# Patient Record
Sex: Male | Born: 1960 | Race: White | Hispanic: No | Marital: Married | Smoking: Never smoker
Health system: Southern US, Community
[De-identification: ages and names within clinical notes are randomized; demographics above are authoritative.]

## PROBLEM LIST (undated history)

## (undated) DIAGNOSIS — E785 Hyperlipidemia, unspecified: Secondary | ICD-10-CM

## (undated) DIAGNOSIS — E559 Vitamin D deficiency, unspecified: Secondary | ICD-10-CM

## (undated) DIAGNOSIS — K760 Fatty (change of) liver, not elsewhere classified: Secondary | ICD-10-CM

## (undated) DIAGNOSIS — M1710 Unilateral primary osteoarthritis, unspecified knee: Secondary | ICD-10-CM

## (undated) DIAGNOSIS — I2510 Atherosclerotic heart disease of native coronary artery without angina pectoris: Secondary | ICD-10-CM

## (undated) DIAGNOSIS — E663 Overweight: Secondary | ICD-10-CM

## (undated) DIAGNOSIS — I10 Essential (primary) hypertension: Secondary | ICD-10-CM

## (undated) DIAGNOSIS — L111 Transient acantholytic dermatosis [Grover]: Secondary | ICD-10-CM

## (undated) DIAGNOSIS — R7301 Impaired fasting glucose: Secondary | ICD-10-CM

## (undated) DIAGNOSIS — C4490 Unspecified malignant neoplasm of skin, unspecified: Secondary | ICD-10-CM

## (undated) DIAGNOSIS — M1990 Unspecified osteoarthritis, unspecified site: Secondary | ICD-10-CM

## (undated) DIAGNOSIS — E7841 Elevated Lipoprotein(a): Secondary | ICD-10-CM

## (undated) DIAGNOSIS — L309 Dermatitis, unspecified: Secondary | ICD-10-CM

## (undated) DIAGNOSIS — F419 Anxiety disorder, unspecified: Secondary | ICD-10-CM

## (undated) DIAGNOSIS — J45909 Unspecified asthma, uncomplicated: Secondary | ICD-10-CM

## (undated) DIAGNOSIS — M75100 Unspecified rotator cuff tear or rupture of unspecified shoulder, not specified as traumatic: Secondary | ICD-10-CM

## (undated) HISTORY — DX: Unilateral primary osteoarthritis, unspecified knee: M17.10

## (undated) HISTORY — DX: Dermatitis, unspecified: L30.9

## (undated) HISTORY — DX: Fatty (change of) liver, not elsewhere classified: K76.0

## (undated) HISTORY — PX: SHOULDER SURGERY: SHX246

## (undated) HISTORY — DX: Unspecified osteoarthritis, unspecified site: M19.90

## (undated) HISTORY — DX: Essential (primary) hypertension: I10

## (undated) HISTORY — DX: Anxiety disorder, unspecified: F41.9

## (undated) HISTORY — PX: KNEE SURGERY: SHX244

## (undated) HISTORY — PX: KNEE ARTHROSCOPY: SUR90

## (undated) HISTORY — DX: Hyperlipidemia, unspecified: E78.5

## (undated) HISTORY — DX: Unspecified rotator cuff tear or rupture of unspecified shoulder, not specified as traumatic: M75.100

## (undated) HISTORY — DX: Gilbert syndrome: E80.4

## (undated) HISTORY — DX: Overweight: E66.3

## (undated) HISTORY — DX: Impaired fasting glucose: R73.01

## (undated) HISTORY — DX: Atherosclerotic heart disease of native coronary artery without angina pectoris: I25.10

## (undated) HISTORY — DX: Transient acantholytic dermatosis (grover): L11.1

## (undated) HISTORY — DX: Elevated lipoprotein(a): E78.41

## (undated) HISTORY — PX: COLONOSCOPY: SHX174

## (undated) HISTORY — DX: Unspecified asthma, uncomplicated: J45.909

## (undated) HISTORY — DX: Vitamin D deficiency, unspecified: E55.9

---

## 2007-05-18 ENCOUNTER — Encounter: Admission: RE | Admit: 2007-05-18 | Discharge: 2007-05-18 | Payer: Self-pay | Admitting: Orthopedic Surgery

## 2011-09-23 ENCOUNTER — Other Ambulatory Visit: Payer: Self-pay | Admitting: Family Medicine

## 2011-09-23 DIAGNOSIS — M25511 Pain in right shoulder: Secondary | ICD-10-CM

## 2011-09-28 ENCOUNTER — Ambulatory Visit
Admission: RE | Admit: 2011-09-28 | Discharge: 2011-09-28 | Disposition: A | Discharge status: Home / Self Care | Payer: BC Managed Care – PPO | Source: Ambulatory Visit | Attending: Family Medicine | Admitting: Family Medicine

## 2011-09-28 DIAGNOSIS — M25511 Pain in right shoulder: Secondary | ICD-10-CM

## 2011-09-28 MED ORDER — IOHEXOL 180 MG/ML  SOLN
15.0000 mL | Freq: Once | INTRAMUSCULAR | Status: AC | PRN
  Administered 2011-09-28: 15 mL via INTRA_ARTICULAR

## 2012-10-25 ENCOUNTER — Encounter: Payer: Self-pay | Admitting: Family Medicine

## 2012-10-25 ENCOUNTER — Ambulatory Visit (INDEPENDENT_AMBULATORY_CARE_PROVIDER_SITE_OTHER): Payer: BC Managed Care – PPO | Admitting: Family Medicine

## 2012-10-25 VITALS — BP 145/91 | HR 89 | Ht 66.0 in | Wt 168.0 lb

## 2012-10-25 DIAGNOSIS — M25569 Pain in unspecified knee: Secondary | ICD-10-CM

## 2012-10-25 DIAGNOSIS — M25561 Pain in right knee: Secondary | ICD-10-CM

## 2012-10-25 NOTE — Patient Instructions (Addendum)
Take tylenol 500mg  1-2 tabs three times a day for pain. Meloxicam 7.5mg  twice a day with food for pain and inflammation. Glucosamine sulfate 750mg  twice a day is a supplement that may help with arthritis. Capsaicin topically up to four times a day may also help with pain. Cortisone injections are an option - you were given one of these today. If cortisone injections do not help, there are different types of shots that may help but they take longer to take effect. It's important that you continue to stay active. Straight leg raises, leg raises with foot turned outward, hip side raises, knee extensions - 3 sets of 10 once a day - add ankle weight if these become too easy. Can consider PT again but you've done this extensively. Shoe inserts with good arch support may be helpful - especially with your flat feet and mild circumduction of your gait. Heat or ice 15 minutes at a time 3-4 times a day as needed to help with pain. Follow up with me as needed.

## 2012-10-26 ENCOUNTER — Encounter: Payer: Self-pay | Admitting: Family Medicine

## 2012-10-26 DIAGNOSIS — M25561 Pain in right knee: Secondary | ICD-10-CM | POA: Insufficient documentation

## 2012-10-26 NOTE — Progress Notes (Signed)
  Subjective:    Patient ID: Jeff Murphy, male    DOB: [birth year], 52 y.o.   MRN: [id number]  PCP: Dr. Prince Rome  HPI 52 yo M here for right knee pain.  Patient reports he's had prior left knee issues primarily. Had an arthroscopy back in 2000 and improved from this - now left knee isn't bothering him. Now right knee causing him issues. Tore meniscus in fall 2012 - had arthroscopic debridement after MRI showed bucket handle medial and lateral meniscal tears - told had arthritis lateral compartment of knee. Able to play hockey but cannot run, play many other sports due to pain in right knee mostly laterally. Tried nsaids, now on meloxicam. If he does any sports activities the next couple days will have pretty bad pain. Did 6 weeks of physical therapy within past 3 months with only some improvement. Catches if he fully extends knee.  Otherwise no locking, giving out.  History reviewed. No pertinent past medical history.  No current outpatient prescriptions on file prior to visit.   No current facility-administered medications on file prior to visit.    Past Surgical History  Procedure Laterality Date  . Knee surgery Left     meniscal debridement  . Shoulder surgery Right     tenodesis - ?biceps    Allergies  Allergen Reactions  . Neosporin Original (Bacitracin-Neomycin-Polymyxin)   . Polysporin (Bacitracin-Polymyxin B)     History   Social History  . Marital Status: Married    Spouse Name: N/A    Number of Children: N/A  . Years of Education: N/A   Occupational History  . Not on file.   Social History Main Topics  . Smoking status: Never Smoker   . Smokeless tobacco: Not on file  . Alcohol Use: Not on file  . Drug Use: Not on file  . Sexually Active: Not on file   Other Topics Concern  . Not on file   Social History Narrative  . No narrative on file    Family History  Problem Relation Age of Onset  . Hypertension Father   . Hypertension Brother   .  Diabetes Neg Hx   . Heart attack Neg Hx   . Hyperlipidemia Neg Hx   . Sudden death Neg Hx     BP 145/91  Pulse 89  Ht 5\' 6"  (1.676 m)  Wt 168 lb (76.204 kg)  BMI 27.13 kg/m2  Review of Systems See HPI above.    Objective:   Physical Exam Gen: NAD  R knee: No gross deformity, ecchymoses, swelling.  Healed surgical scars. TTP lateral joint line.  Minimal tenderness post patellar facets, no medial joint line tenderness. FROM. Negative ant/post drawers. Negative valgus/varus testing. Negative lachmanns. Negative mcmurrays, apleys, patellar apprehension, clarkes. NV intact distally.     Assessment & Plan:  1. 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  Discussed possibly repeating x-rays but would not change our initial management so declined.  Continue meloxicam.  Tylenol, glucosamine, capsaicin.  Trial intraarticular cortisone injection today.  Start quad strengthening.  Arch support for mild gait circumduction, overpronation.  F/u prn.  After informed written consent, patient was seated on exam table. Right knee was prepped with alcohol swab and utilizing anteromedial approach, patient's right knee was injected intraarticularly with 3:1 marcaine: depomedrol. Patient tolerated the procedure well without immediate complications.

## 2012-10-26 NOTE — Assessment & Plan Note (Signed)
2/2 DJD.  Discussed possibly repeating x-rays but would not change our initial management so declined.  Continue meloxicam.  Tylenol, glucosamine, capsaicin.  Trial intraarticular cortisone injection today.  Start quad strengthening.  Arch support for mild gait circumduction, overpronation.  F/u prn.  After informed written consent, patient was seated on exam table. Right knee was prepped with alcohol swab and utilizing anteromedial approach, patient's right knee was injected intraarticularly with 3:1 marcaine: depomedrol. Patient tolerated the procedure well without immediate complications.

## 2013-07-10 ENCOUNTER — Other Ambulatory Visit: Payer: Self-pay | Admitting: Orthopedic Surgery

## 2013-07-10 DIAGNOSIS — M25512 Pain in left shoulder: Secondary | ICD-10-CM

## 2013-07-12 ENCOUNTER — Ambulatory Visit
Admission: RE | Admit: 2013-07-12 | Discharge: 2013-07-12 | Disposition: A | Discharge status: Home / Self Care | Payer: BC Managed Care – PPO | Source: Ambulatory Visit | Attending: Orthopedic Surgery | Admitting: Orthopedic Surgery

## 2013-07-12 DIAGNOSIS — M25512 Pain in left shoulder: Secondary | ICD-10-CM

## 2014-01-21 ENCOUNTER — Ambulatory Visit (INDEPENDENT_AMBULATORY_CARE_PROVIDER_SITE_OTHER): Payer: BC Managed Care – PPO | Admitting: Family Medicine

## 2014-01-21 ENCOUNTER — Ambulatory Visit (HOSPITAL_BASED_OUTPATIENT_CLINIC_OR_DEPARTMENT_OTHER)
Admission: RE | Admit: 2014-01-21 | Discharge: 2014-01-21 | Disposition: A | Discharge status: Home / Self Care | Payer: BC Managed Care – PPO | Source: Ambulatory Visit | Attending: Family Medicine | Admitting: Family Medicine

## 2014-01-21 ENCOUNTER — Encounter: Payer: Self-pay | Admitting: Family Medicine

## 2014-01-21 VITALS — Ht 66.0 in | Wt 168.0 lb

## 2014-01-21 DIAGNOSIS — M25551 Pain in right hip: Secondary | ICD-10-CM

## 2014-01-21 DIAGNOSIS — M25559 Pain in unspecified hip: Secondary | ICD-10-CM

## 2014-01-21 DIAGNOSIS — M169 Osteoarthritis of hip, unspecified: Secondary | ICD-10-CM | POA: Insufficient documentation

## 2014-01-21 DIAGNOSIS — M1610 Unilateral primary osteoarthritis, unspecified hip: Secondary | ICD-10-CM | POA: Insufficient documentation

## 2014-01-21 NOTE — Patient Instructions (Signed)
General arthritis instructions: Take tylenol 500mg  1-2 tabs three times a day for pain. Aleve 1-2 tabs twice a day with food Glucosamine sulfate 750mg  twice a day is a supplement that may help. Capsaicin topically up to four times a day may also help with pain. Cortisone injections are an option - we will set one of these up for you. It's important that you continue to stay active. Consider physical therapy to strengthen muscles around the joint that hurts to take pressure off of the joint itself. Shoe inserts with good arch support may be helpful. Heat or ice 15 minutes at a time 3-4 times a day as needed to help with pain. Be careful stretching the hip joint out. Follow up with me as needed.

## 2014-01-22 ENCOUNTER — Encounter: Payer: Self-pay | Admitting: Family Medicine

## 2014-01-22 DIAGNOSIS — M25551 Pain in right hip: Secondary | ICD-10-CM | POA: Insufficient documentation

## 2014-01-22 NOTE — Progress Notes (Signed)
Patient ID: Jeff Murphy, male   DOB: 1960/12/14, 53 y.o.   MRN: [id number]  PCP: Jerlyn Ly, MD  Subjective:   HPI: Patient is a 53 y.o. male here for right hip pain.  Patient reports for about 6 months he's had pain in right hip/groin. Has worsened over this time and last week disrupted his sleep. Still active - playing hockey, running. Pain can radiate to the back at times. No numbness or tingling. No bowel/bladder dysfunction. No known trauma of this hip.  History reviewed. No pertinent past medical history.  Current Outpatient Prescriptions on File Prior to Visit  Medication Sig Dispense Refill  . meloxicam (MOBIC) 7.5 MG tablet        No current facility-administered medications on file prior to visit.    Past Surgical History  Procedure Laterality Date  . Knee surgery Left     meniscal debridement  . Shoulder surgery Right     tenodesis - ?biceps    Allergies  Allergen Reactions  . Neosporin Original [Bacitracin-Neomycin-Polymyxin]   . Polysporin [Bacitracin-Polymyxin B]     History   Social History  . Marital Status: Married    Spouse Name: N/A    Number of Children: N/A  . Years of Education: N/A   Occupational History  . Not on file.   Social History Main Topics  . Smoking status: Never Smoker   . Smokeless tobacco: Not on file  . Alcohol Use: Not on file  . Drug Use: Not on file  . Sexual Activity: Not on file   Other Topics Concern  . Not on file   Social History Narrative  . No narrative on file    Family History  Problem Relation Age of Onset  . Hypertension Father   . Hypertension Brother   . Diabetes Neg Hx   . Heart attack Neg Hx   . Hyperlipidemia Neg Hx   . Sudden death Neg Hx     Ht 5\' 6"  (1.676 m)  Wt 168 lb (76.204 kg)  BMI 27.13 kg/m2  Review of Systems: See HPI above.    Objective:  Physical Exam:  Gen: NAD  Right hip/back: No gross deformity, scoliosis. No TTP of back or hip including trochanter.  No  midline or bony TTP. FROM back without pain.  Only about 10 degrees IR of hip, reproducing hip pain. Strength LEs 5/5 all muscle groups.   2+ MSRs in patellar and achilles tendons, equal bilaterally. Negative SLRs. Sensation intact to light touch bilaterally. Positive right logroll of hip.  Negative left. Negative fabers and piriformis stretches but pain in groin/hip with right fabers.    Assessment & Plan:  1. Right hip pain - radiographs confirmed moderate DJD, less arthritis than left hip.  Exam consistent with this being the cause of his pain.  Tylenol, nsaids, glucosamine, capsaicin.  Will go ahead with intraarticular hip injection at [facility].  Hip strengthening exercises.  Heat or ice as needed.  F/u prn.

## 2014-01-22 NOTE — Assessment & Plan Note (Signed)
radiographs confirmed moderate DJD, less arthritis than left hip.  Exam consistent with this being the cause of his pain.  Tylenol, nsaids, glucosamine, capsaicin.  Will go ahead with intraarticular hip injection at [facility].  Hip strengthening exercises.  Heat or ice as needed.  F/u prn.

## 2014-01-23 ENCOUNTER — Other Ambulatory Visit: Payer: Self-pay | Admitting: Family Medicine

## 2014-01-23 DIAGNOSIS — M25551 Pain in right hip: Secondary | ICD-10-CM

## 2014-01-30 ENCOUNTER — Other Ambulatory Visit: Payer: BC Managed Care – PPO

## 2014-10-20 ENCOUNTER — Telehealth: Payer: Self-pay | Admitting: Family Medicine

## 2014-10-20 NOTE — Telephone Encounter (Signed)
It's been 9 months since we've seen him - probably need to see him back first to ensure it's still from his hip arthritis and then order it.

## 2014-10-23 ENCOUNTER — Encounter: Payer: Self-pay | Admitting: Family Medicine

## 2014-10-23 ENCOUNTER — Encounter (INDEPENDENT_AMBULATORY_CARE_PROVIDER_SITE_OTHER): Payer: Self-pay

## 2014-10-23 ENCOUNTER — Ambulatory Visit (INDEPENDENT_AMBULATORY_CARE_PROVIDER_SITE_OTHER): Payer: BC Managed Care – PPO | Admitting: Family Medicine

## 2014-10-23 VITALS — BP 128/85 | HR 86 | Ht 66.0 in | Wt 167.0 lb

## 2014-10-23 DIAGNOSIS — M25551 Pain in right hip: Secondary | ICD-10-CM | POA: Diagnosis not present

## 2014-10-23 NOTE — Telephone Encounter (Signed)
Spoke to patient and made appointment.  

## 2014-10-24 ENCOUNTER — Other Ambulatory Visit: Payer: Self-pay | Admitting: Family Medicine

## 2014-10-24 DIAGNOSIS — M25551 Pain in right hip: Secondary | ICD-10-CM

## 2014-10-27 NOTE — Progress Notes (Signed)
Patient ID: Carvell Hoeffner, male   DOB: 1960-08-05, 54 y.o.   MRN: [id number]  PCP: Jerlyn Ly, MD  Subjective:   HPI: Patient is a 54 y.o. male here for right hip pain.  01/21/14: Patient reports for about 6 months he's had pain in right hip/groin. Has worsened over this time and last week disrupted his sleep. Still active - playing hockey, running. Pain can radiate to the back at times. No numbness or tingling. No bowel/bladder dysfunction. No known trauma of this hip.  10/23/14: Patient reports pain in right hip slowly worsened past couple months. 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 Never had to get intraarticular injection but interested in this now. Pain currently 3/10.  No past medical history on file.  Current Outpatient Prescriptions on File Prior to Visit  Medication Sig Dispense Refill  . meloxicam (MOBIC) 7.5 MG tablet      No current facility-administered medications on file prior to visit.    Past Surgical History  Procedure Laterality Date  . Knee surgery Left     meniscal debridement  . Shoulder surgery Right     tenodesis - ?biceps    Allergies  Allergen Reactions  . Neosporin Original [Bacitracin-Neomycin-Polymyxin]   . Polysporin [Bacitracin-Polymyxin B]     History   Social History  . Marital Status: Married    Spouse Name: N/A  . Number of Children: N/A  . Years of Education: N/A   Occupational History  . Not on file.   Social History Main Topics  . Smoking status: Never Smoker   . Smokeless tobacco: Not on file  . Alcohol Use: Not on file  . Drug Use: Not on file  . Sexual Activity: Not on file   Other Topics Concern  . Not on file   Social History Narrative    Family History  Problem Relation Age of Onset  . Hypertension Father   . Hypertension Brother   . Diabetes Neg Hx   . Heart attack Neg Hx   . Hyperlipidemia Neg Hx   . Sudden death Neg Hx     BP 128/85 mmHg  Pulse 86  Ht 5\' 6"  (1.676 m)  Wt  167 lb (75.751 kg)  BMI 26.97 kg/m2  Review of Systems: See HPI above.    Objective:  Physical Exam:  Gen: NAD  Right hip/back: No gross deformity, scoliosis. No TTP of back or hip including trochanter.  No midline or bony TTP. FROM back without pain.  Only about 10 degrees IR of hip, reproducing hip pain. Strength LEs 5/5 all muscle groups.   Negative SLRs. Positive right logroll of hip.  Negative left.    Assessment & Plan:  1. Right hip pain - radiographs confirmed moderate DJD, less arthritis than left hip.  Exam consistent with DJD as cause of his pain.  Will go ahead with intraarticular hip injection at [facility].  Hip strengthening exercises.  Heat or ice as needed.  F/u prn.

## 2014-10-27 NOTE — Assessment & Plan Note (Signed)
radiographs confirmed moderate DJD, less arthritis than left hip.  Exam consistent with DJD as cause of his pain.  Will go ahead with intraarticular hip injection at [facility].  Hip strengthening exercises.  Heat or ice as needed.  F/u prn.

## 2015-01-14 ENCOUNTER — Ambulatory Visit
Admission: RE | Admit: 2015-01-14 | Discharge: 2015-01-14 | Disposition: A | Discharge status: Home / Self Care | Payer: BC Managed Care – PPO | Source: Ambulatory Visit | Attending: Family Medicine | Admitting: Family Medicine

## 2015-01-14 DIAGNOSIS — M25551 Pain in right hip: Secondary | ICD-10-CM

## 2015-01-14 MED ORDER — METHYLPREDNISOLONE ACETATE 40 MG/ML INJ SUSP (RADIOLOG
120.0000 mg | Freq: Once | INTRAMUSCULAR | Status: AC
  Administered 2015-01-14: 120 mg via INTRA_ARTICULAR

## 2015-01-14 MED ORDER — IOHEXOL 180 MG/ML  SOLN
1.0000 mL | Freq: Once | INTRAMUSCULAR | Status: AC | PRN
  Administered 2015-01-14: 1 mL via INTRA_ARTICULAR

## 2015-02-02 ENCOUNTER — Telehealth: Payer: Self-pay | Admitting: Family Medicine

## 2015-02-02 NOTE — Telephone Encounter (Signed)
Oh no!  You start to worry the arthritis is too bad if an injection didn't seem to help at all.  We had discussed tylenol, anti-inflammatories, capsaicin, glucosamine.  He can consider adding physical therapy to this also.  Another consideration is supartz but I'm not certain anyone around here is using that for hips (data is mainly on knees).  Final option is to see ortho to discuss hip replacement surgery.

## 2015-02-03 NOTE — Telephone Encounter (Signed)
Spoke to patient at end of day on 02-02-15 and gave him information as provided by physician. Patient stated that he was going to talk with a Psychologist, sport and exercise.

## 2015-05-27 ENCOUNTER — Other Ambulatory Visit (HOSPITAL_COMMUNITY): Payer: Self-pay | Admitting: Orthopaedic Surgery

## 2015-05-29 NOTE — Pre-Procedure Instructions (Signed)
    Jeff Murphy  05/29/2015      [id number] [name], [place] [address] Phone: [phone] Fax: [phone]    Your procedure is scheduled on Tuesday, December 6.  Report to [facility] Admitting at 1:40 P.M.              Your surgery is scheduled for  3:40P.M.   Call this number if you have problems the morning of surgery:[phone]                 For any other questions, please call [phone], Monday - Friday 8 AM - 4 PM.   Remember:  Do not eat food or drink liquids after midnight Monday,December 5.  Take these medicines the morning of surgery with A SIP OF WATER : Take if needed Tylenol.   Do not wear jewelry, make-up or nail polish.   Do not wear lotions, powders, or perfumes.                 Men may shave face and neck.   Do not bring valuables to the hospital.   [facility] is not responsible for any belongings or valuables.  Contacts, dentures or bridgework may not be worn into surgery.  Leave your suitcase in the car.  After surgery it may be brought to your room.  For patients admitted to the hospital, discharge time will be determined by your treatment team.   Special instructions: Review  [facility] - Preparing For Surgery.  Please read over the following fact sheets that you were given. Pain Booklet, Coughing and Deep Breathing and Surgical Site Infection Prevention

## 2015-06-01 ENCOUNTER — Encounter (HOSPITAL_COMMUNITY): Payer: Self-pay

## 2015-06-01 ENCOUNTER — Encounter (HOSPITAL_COMMUNITY)
Admission: RE | Admit: 2015-06-01 | Discharge: 2015-06-01 | Disposition: A | Discharge status: Home / Self Care | Payer: BC Managed Care – PPO | Source: Ambulatory Visit | Attending: Orthopaedic Surgery | Admitting: Orthopaedic Surgery

## 2015-06-01 DIAGNOSIS — Z01812 Encounter for preprocedural laboratory examination: Secondary | ICD-10-CM | POA: Insufficient documentation

## 2015-06-01 DIAGNOSIS — M1611 Unilateral primary osteoarthritis, right hip: Secondary | ICD-10-CM | POA: Insufficient documentation

## 2015-06-01 HISTORY — DX: Unspecified osteoarthritis, unspecified site: M19.90

## 2015-06-01 HISTORY — DX: Unspecified malignant neoplasm of skin, unspecified: C44.90

## 2015-06-01 LAB — CBC
HEMATOCRIT: 42.6 % (ref 39.0–52.0)
HEMOGLOBIN: 14.5 g/dL (ref 13.0–17.0)
MCH: 30.7 pg (ref 26.0–34.0)
MCHC: 34 g/dL (ref 30.0–36.0)
MCV: 90.3 fL (ref 78.0–100.0)
Platelets: 223 10*3/uL (ref 150–400)
RBC: 4.72 MIL/uL (ref 4.22–5.81)
RDW: 13.1 % (ref 11.5–15.5)
WBC: 6.1 10*3/uL (ref 4.0–10.5)

## 2015-06-01 LAB — BASIC METABOLIC PANEL
ANION GAP: 9 (ref 5–15)
BUN: 15 mg/dL (ref 6–20)
CHLORIDE: 105 mmol/L (ref 101–111)
CO2: 26 mmol/L (ref 22–32)
Calcium: 9.4 mg/dL (ref 8.9–10.3)
Creatinine, Ser: 0.8 mg/dL (ref 0.61–1.24)
GFR calc non Af Amer: >60 mL/min (ref >60)
Glucose, Bld: 97 mg/dL (ref 65–99)
POTASSIUM: 4.1 mmol/L (ref 3.5–5.1)
SODIUM: 140 mmol/L (ref 135–145)

## 2015-06-01 LAB — SURGICAL PCR SCREEN
MRSA, PCR: NEGATIVE
Staphylococcus aureus: POSITIVE — AB

## 2015-06-01 NOTE — Progress Notes (Signed)
Nurse called [facility] and then called patient and informed him of positive PCR results. Patient verbalized understanding and stated [facility] would [facility] and [facility].

## 2015-06-01 NOTE — Pre-Procedure Instructions (Signed)
Jeff Murphy  06/01/2015     Your procedure is scheduled on Tuesday June 09, 2015 at 3:40 PM.  Report to [facility] Admitting at 1:40 P.M.                Call this number if you have problems the morning of surgery:[phone]                 For any other questions, please call [phone], Monday - Friday 8 AM - 4 PM.   Remember:  Do not eat food or drink liquids after midnight Monday,December 5.  Take these medicines the morning of surgery with A SIP OF WATER : Take if needed Tylenol.   Do not wear jewelry.  Do not wear lotions, powders, or cologne.   Men may shave face and neck.  Do not bring valuables to the hospital.   [facility] is not responsible for any belongings or valuables.  Contacts, dentures or bridgework may not be worn into surgery.  Leave your suitcase in the car.  After surgery it may be brought to your room.  For patients admitted to the hospital, discharge time will be determined by your treatment team.  Special instructions: Shower using CHG soap the night before and the morning of your surgery  Please read over the following fact sheets that you were given. Pain Booklet, Coughing and Deep Breathing and Surgical Site Infection Prevention

## 2015-06-08 MED ORDER — CEFAZOLIN SODIUM-DEXTROSE 2-3 GM-% IV SOLR
2.0000 g | INTRAVENOUS | Status: AC
  Administered 2015-06-09: 2 g via INTRAVENOUS
  Filled 2015-06-08: qty 50

## 2015-06-08 MED ORDER — TRANEXAMIC ACID 1000 MG/10ML IV SOLN
1000.0000 mg | INTRAVENOUS | Status: AC
  Administered 2015-06-09: 1000 mg via INTRAVENOUS
  Filled 2015-06-08 (×2): qty 10

## 2015-06-09 ENCOUNTER — Inpatient Hospital Stay (HOSPITAL_COMMUNITY): Payer: BC Managed Care – PPO | Admitting: Anesthesiology

## 2015-06-09 ENCOUNTER — Inpatient Hospital Stay (HOSPITAL_COMMUNITY)
Admission: RE | Admit: 2015-06-09 | Discharge: 2015-06-10 | DRG: 470 | Disposition: A | Discharge status: Home / Self Care | Payer: BC Managed Care – PPO | Source: Ambulatory Visit | Attending: Orthopaedic Surgery | Admitting: Orthopaedic Surgery

## 2015-06-09 ENCOUNTER — Encounter (HOSPITAL_COMMUNITY)
Admission: RE | Disposition: A | Discharge status: Home / Self Care | Payer: Self-pay | Source: Ambulatory Visit | Attending: Orthopaedic Surgery

## 2015-06-09 ENCOUNTER — Inpatient Hospital Stay (HOSPITAL_COMMUNITY): Payer: BC Managed Care – PPO

## 2015-06-09 ENCOUNTER — Encounter (HOSPITAL_COMMUNITY): Payer: Self-pay | Admitting: Certified Registered"

## 2015-06-09 DIAGNOSIS — M1611 Unilateral primary osteoarthritis, right hip: Principal | ICD-10-CM

## 2015-06-09 DIAGNOSIS — Z91018 Allergy to other foods: Secondary | ICD-10-CM

## 2015-06-09 DIAGNOSIS — Z419 Encounter for procedure for purposes other than remedying health state, unspecified: Secondary | ICD-10-CM

## 2015-06-09 DIAGNOSIS — Z96641 Presence of right artificial hip joint: Secondary | ICD-10-CM

## 2015-06-09 DIAGNOSIS — Z886 Allergy status to analgesic agent status: Secondary | ICD-10-CM | POA: Diagnosis not present

## 2015-06-09 DIAGNOSIS — M25551 Pain in right hip: Secondary | ICD-10-CM | POA: Diagnosis present

## 2015-06-09 DIAGNOSIS — Z8249 Family history of ischemic heart disease and other diseases of the circulatory system: Secondary | ICD-10-CM

## 2015-06-09 DIAGNOSIS — Z85828 Personal history of other malignant neoplasm of skin: Secondary | ICD-10-CM

## 2015-06-09 DIAGNOSIS — Z883 Allergy status to other anti-infective agents status: Secondary | ICD-10-CM

## 2015-06-09 HISTORY — PX: TOTAL HIP ARTHROPLASTY: SHX124

## 2015-06-09 SURGERY — ARTHROPLASTY, HIP, TOTAL, ANTERIOR APPROACH
Anesthesia: General | Site: Hip | Laterality: Right

## 2015-06-09 MED ORDER — ALUM & MAG HYDROXIDE-SIMETH 200-200-20 MG/5ML PO SUSP
30.0000 mL | ORAL | Status: DC | PRN
Start: 2015-06-09 — End: 2015-06-10

## 2015-06-09 MED ORDER — BUPIVACAINE HCL (PF) 0.5 % IJ SOLN
INTRAMUSCULAR | Status: DC | PRN
  Administered 2015-06-09: 3 mL via INTRATHECAL

## 2015-06-09 MED ORDER — ACETAMINOPHEN 650 MG RE SUPP: 650.0000 mg | Freq: Four times a day (QID) | RECTAL | Status: DC | PRN

## 2015-06-09 MED ORDER — MIDAZOLAM HCL 5 MG/5ML IJ SOLN
INTRAMUSCULAR | Status: DC | PRN
  Administered 2015-06-09: 2 mg via INTRAVENOUS

## 2015-06-09 MED ORDER — DEXAMETHASONE SODIUM PHOSPHATE 10 MG/ML IJ SOLN
INTRAMUSCULAR | Status: DC | PRN
  Administered 2015-06-09: 10 mg via INTRAVENOUS

## 2015-06-09 MED ORDER — ACETAMINOPHEN 325 MG PO TABS: 650.0000 mg | ORAL_TABLET | Freq: Four times a day (QID) | ORAL | Status: DC | PRN

## 2015-06-09 MED ORDER — NEOSTIGMINE METHYLSULFATE 10 MG/10ML IV SOLN
INTRAVENOUS | Status: DC | PRN
  Administered 2015-06-09: 5 mg via INTRAVENOUS

## 2015-06-09 MED ORDER — PHENYLEPHRINE 40 MCG/ML (10ML) SYRINGE FOR IV PUSH (FOR BLOOD PRESSURE SUPPORT)
PREFILLED_SYRINGE | INTRAVENOUS | Status: AC
  Filled 2015-06-09: qty 10

## 2015-06-09 MED ORDER — ONDANSETRON HCL 4 MG PO TABS: 4.0000 mg | ORAL_TABLET | Freq: Four times a day (QID) | ORAL | Status: DC | PRN

## 2015-06-09 MED ORDER — MIDAZOLAM HCL 2 MG/2ML IJ SOLN
INTRAMUSCULAR | Status: AC
  Filled 2015-06-09: qty 2

## 2015-06-09 MED ORDER — EPHEDRINE SULFATE 50 MG/ML IJ SOLN
INTRAMUSCULAR | Status: DC | PRN
  Administered 2015-06-09 (×2): 10 mg via INTRAVENOUS

## 2015-06-09 MED ORDER — ROCURONIUM BROMIDE 100 MG/10ML IV SOLN
INTRAVENOUS | Status: DC | PRN
  Administered 2015-06-09: 50 mg via INTRAVENOUS

## 2015-06-09 MED ORDER — DEXTROSE 5 % IV SOLN
10.0000 mg | INTRAVENOUS | Status: DC | PRN
  Administered 2015-06-09: 10 ug/min via INTRAVENOUS

## 2015-06-09 MED ORDER — RIVAROXABAN 10 MG PO TABS
10.0000 mg | ORAL_TABLET | Freq: Every day | ORAL | Status: DC
  Administered 2015-06-10: 10 mg via ORAL
  Filled 2015-06-09: qty 1

## 2015-06-09 MED ORDER — ZOLPIDEM TARTRATE 5 MG PO TABS
5.0000 mg | ORAL_TABLET | Freq: Every evening | ORAL | Status: DC | PRN
Start: 2015-06-09 — End: 2015-06-10
  Administered 2015-06-09: 5 mg via ORAL
  Filled 2015-06-09: qty 1

## 2015-06-09 MED ORDER — ONDANSETRON HCL 4 MG/2ML IJ SOLN: 4.0000 mg | Freq: Four times a day (QID) | INTRAMUSCULAR | Status: DC | PRN

## 2015-06-09 MED ORDER — LACTATED RINGERS IV SOLN
INTRAVENOUS | Status: DC | PRN
  Administered 2015-06-09 (×2): via INTRAVENOUS

## 2015-06-09 MED ORDER — LIDOCAINE HCL (CARDIAC) 20 MG/ML IV SOLN
INTRAVENOUS | Status: DC | PRN
  Administered 2015-06-09: 60 mg via INTRAVENOUS

## 2015-06-09 MED ORDER — METHOCARBAMOL 500 MG PO TABS
500.0000 mg | ORAL_TABLET | Freq: Four times a day (QID) | ORAL | Status: DC | PRN
  Administered 2015-06-09 – 2015-06-10 (×3): 500 mg via ORAL
  Filled 2015-06-09 (×3): qty 1

## 2015-06-09 MED ORDER — FENTANYL CITRATE (PF) 250 MCG/5ML IJ SOLN
INTRAMUSCULAR | Status: AC
  Filled 2015-06-09: qty 5

## 2015-06-09 MED ORDER — CEFAZOLIN SODIUM 1-5 GM-% IV SOLN
1.0000 g | Freq: Four times a day (QID) | INTRAVENOUS | Status: AC
  Administered 2015-06-09 – 2015-06-10 (×2): 1 g via INTRAVENOUS
  Filled 2015-06-09 (×2): qty 50

## 2015-06-09 MED ORDER — DIPHENHYDRAMINE HCL 12.5 MG/5ML PO ELIX: 12.5000 mg | ORAL_SOLUTION | ORAL | Status: DC | PRN

## 2015-06-09 MED ORDER — METOCLOPRAMIDE HCL 5 MG/ML IJ SOLN: 5.0000 mg | Freq: Three times a day (TID) | INTRAMUSCULAR | Status: DC | PRN

## 2015-06-09 MED ORDER — METHOCARBAMOL 1000 MG/10ML IJ SOLN
500.0000 mg | Freq: Four times a day (QID) | INTRAVENOUS | Status: DC | PRN
  Filled 2015-06-09: qty 5

## 2015-06-09 MED ORDER — 0.9 % SODIUM CHLORIDE (POUR BTL) OPTIME
TOPICAL | Status: DC | PRN
  Administered 2015-06-09: 1000 mL

## 2015-06-09 MED ORDER — PHENYLEPHRINE HCL 10 MG/ML IJ SOLN
INTRAMUSCULAR | Status: AC
  Filled 2015-06-09: qty 1

## 2015-06-09 MED ORDER — DOCUSATE SODIUM 100 MG PO CAPS
100.0000 mg | ORAL_CAPSULE | Freq: Two times a day (BID) | ORAL | Status: DC
  Administered 2015-06-09 – 2015-06-10 (×2): 100 mg via ORAL
  Filled 2015-06-09 (×2): qty 1

## 2015-06-09 MED ORDER — PROPOFOL 10 MG/ML IV BOLUS
INTRAVENOUS | Status: DC | PRN
  Administered 2015-06-09: 150 mg via INTRAVENOUS

## 2015-06-09 MED ORDER — SODIUM CHLORIDE 0.9 % IR SOLN
Status: DC | PRN
  Administered 2015-06-09: 3000 mL

## 2015-06-09 MED ORDER — PROPOFOL 500 MG/50ML IV EMUL
INTRAVENOUS | Status: DC | PRN
  Administered 2015-06-09: 30 ug/kg/min via INTRAVENOUS

## 2015-06-09 MED ORDER — HYDROMORPHONE HCL 1 MG/ML IJ SOLN: 0.2500 mg | INTRAMUSCULAR | Status: DC | PRN

## 2015-06-09 MED ORDER — HYDROMORPHONE HCL 1 MG/ML IJ SOLN
1.0000 mg | INTRAMUSCULAR | Status: DC | PRN
  Administered 2015-06-09: 1 mg via INTRAVENOUS
  Filled 2015-06-09: qty 1

## 2015-06-09 MED ORDER — METOCLOPRAMIDE HCL 5 MG PO TABS: 5.0000 mg | ORAL_TABLET | Freq: Three times a day (TID) | ORAL | Status: DC | PRN

## 2015-06-09 MED ORDER — FENTANYL CITRATE (PF) 250 MCG/5ML IJ SOLN
INTRAMUSCULAR | Status: DC | PRN
  Administered 2015-06-09: 100 ug via INTRAVENOUS
  Administered 2015-06-09: 50 ug via INTRAVENOUS

## 2015-06-09 MED ORDER — OXYCODONE HCL 5 MG PO TABS
5.0000 mg | ORAL_TABLET | ORAL | Status: DC | PRN
  Administered 2015-06-09: 10 mg via ORAL
  Administered 2015-06-10 (×2): 15 mg via ORAL
  Administered 2015-06-10: 10 mg via ORAL
  Filled 2015-06-09 (×2): qty 3
  Filled 2015-06-09: qty 2
  Filled 2015-06-09: qty 3

## 2015-06-09 MED ORDER — PHENOL 1.4 % MT LIQD: 1.0000 | OROMUCOSAL | Status: DC | PRN

## 2015-06-09 MED ORDER — GLYCOPYRROLATE 0.2 MG/ML IJ SOLN
INTRAMUSCULAR | Status: DC | PRN
  Administered 2015-06-09: .1 mg via INTRAVENOUS
  Administered 2015-06-09: .6 mg via INTRAVENOUS
  Administered 2015-06-09: .1 mg via INTRAVENOUS

## 2015-06-09 MED ORDER — ONDANSETRON HCL 4 MG/2ML IJ SOLN
INTRAMUSCULAR | Status: DC | PRN
  Administered 2015-06-09: 4 mg via INTRAVENOUS

## 2015-06-09 MED ORDER — SODIUM CHLORIDE 0.9 % IV SOLN
INTRAVENOUS | Status: DC
  Administered 2015-06-09: 21:00:00 via INTRAVENOUS

## 2015-06-09 MED ORDER — PROMETHAZINE HCL 25 MG/ML IJ SOLN
6.2500 mg | INTRAMUSCULAR | Status: DC | PRN
Start: 2015-06-09 — End: 2015-06-09

## 2015-06-09 MED ORDER — MENTHOL 3 MG MT LOZG: 1.0000 | LOZENGE | OROMUCOSAL | Status: DC | PRN

## 2015-06-09 MED ORDER — ONDANSETRON HCL 4 MG/2ML IJ SOLN
INTRAMUSCULAR | Status: AC
  Filled 2015-06-09: qty 2

## 2015-06-09 MED ORDER — PHENYLEPHRINE HCL 10 MG/ML IJ SOLN
INTRAMUSCULAR | Status: DC | PRN
  Administered 2015-06-09: 80 ug via INTRAVENOUS

## 2015-06-09 MED ORDER — INFLUENZA VAC SPLIT QUAD 0.5 ML IM SUSY
0.5000 mL | PREFILLED_SYRINGE | INTRAMUSCULAR | Status: AC
  Administered 2015-06-10: 0.5 mL via INTRAMUSCULAR
  Filled 2015-06-09: qty 0.5

## 2015-06-09 SURGICAL SUPPLY — 53 items
BENZOIN TINCTURE PRP APPL 2/3 (GAUZE/BANDAGES/DRESSINGS) ×3 IMPLANT
BLADE SAW SGTL 18X1.27X75 (BLADE) ×2 IMPLANT
BLADE SAW SGTL 18X1.27X75MM (BLADE) ×1
BLADE SURG ROTATE 9660 (MISCELLANEOUS) IMPLANT
CAPT HIP TOTAL 2 ×3 IMPLANT
CELLS DAT CNTRL 66122 CELL SVR (MISCELLANEOUS) ×1 IMPLANT
CLOSURE STERI-STRIP 1/2X4 (GAUZE/BANDAGES/DRESSINGS) ×1
CLOSURE WOUND 1/2 X4 (GAUZE/BANDAGES/DRESSINGS) ×2
CLSR STERI-STRIP ANTIMIC 1/2X4 (GAUZE/BANDAGES/DRESSINGS) ×2 IMPLANT
COVER SURGICAL LIGHT HANDLE (MISCELLANEOUS) ×3 IMPLANT
DRAPE C-ARM 42X72 X-RAY (DRAPES) ×3 IMPLANT
DRAPE STERI IOBAN 125X83 (DRAPES) ×3 IMPLANT
DRAPE U-SHAPE 47X51 STRL (DRAPES) ×9 IMPLANT
DRSG AQUACEL AG ADV 3.5X10 (GAUZE/BANDAGES/DRESSINGS) ×3 IMPLANT
DURAPREP 26ML APPLICATOR (WOUND CARE) ×3 IMPLANT
ELECT BLADE 4.0 EZ CLEAN MEGAD (MISCELLANEOUS) ×3
ELECT BLADE 6.5 EXT (BLADE) IMPLANT
ELECT REM PT RETURN 9FT ADLT (ELECTROSURGICAL) ×3
ELECTRODE BLDE 4.0 EZ CLN MEGD (MISCELLANEOUS) ×1 IMPLANT
ELECTRODE REM PT RTRN 9FT ADLT (ELECTROSURGICAL) ×1 IMPLANT
FACESHIELD WRAPAROUND (MASK) ×6 IMPLANT
GLOVE BIOGEL PI IND STRL 8 (GLOVE) ×2 IMPLANT
GLOVE BIOGEL PI INDICATOR 8 (GLOVE) ×4
GLOVE ECLIPSE 8.0 STRL XLNG CF (GLOVE) ×3 IMPLANT
GLOVE ORTHO TXT STRL SZ7.5 (GLOVE) ×6 IMPLANT
GOWN STRL REUS W/ TWL LRG LVL3 (GOWN DISPOSABLE) ×2 IMPLANT
GOWN STRL REUS W/ TWL XL LVL3 (GOWN DISPOSABLE) ×2 IMPLANT
GOWN STRL REUS W/TWL LRG LVL3 (GOWN DISPOSABLE) ×4
GOWN STRL REUS W/TWL XL LVL3 (GOWN DISPOSABLE) ×4
HANDPIECE INTERPULSE COAX TIP (DISPOSABLE) ×2
KIT BASIN OR (CUSTOM PROCEDURE TRAY) ×3 IMPLANT
KIT ROOM TURNOVER OR (KITS) ×3 IMPLANT
MANIFOLD NEPTUNE II (INSTRUMENTS) ×3 IMPLANT
NS IRRIG 1000ML POUR BTL (IV SOLUTION) ×3 IMPLANT
PACK TOTAL JOINT (CUSTOM PROCEDURE TRAY) ×3 IMPLANT
PACK UNIVERSAL I (CUSTOM PROCEDURE TRAY) ×3 IMPLANT
PAD ARMBOARD 7.5X6 YLW CONV (MISCELLANEOUS) ×3 IMPLANT
RTRCTR WOUND ALEXIS 18CM MED (MISCELLANEOUS) ×3
SET HNDPC FAN SPRY TIP SCT (DISPOSABLE) ×1 IMPLANT
STAPLER VISISTAT 35W (STAPLE) IMPLANT
STRIP CLOSURE SKIN 1/2X4 (GAUZE/BANDAGES/DRESSINGS) ×4 IMPLANT
SUT ETHIBOND NAB CT1 #1 30IN (SUTURE) ×3 IMPLANT
SUT MNCRL AB 4-0 PS2 18 (SUTURE) IMPLANT
SUT VIC AB 0 CT1 27 (SUTURE) ×2
SUT VIC AB 0 CT1 27XBRD ANBCTR (SUTURE) ×1 IMPLANT
SUT VIC AB 1 CT1 27 (SUTURE) ×2
SUT VIC AB 1 CT1 27XBRD ANBCTR (SUTURE) ×1 IMPLANT
SUT VIC AB 2-0 CT1 27 (SUTURE) ×4
SUT VIC AB 2-0 CT1 TAPERPNT 27 (SUTURE) ×2 IMPLANT
TOWEL OR 17X24 6PK STRL BLUE (TOWEL DISPOSABLE) ×3 IMPLANT
TOWEL OR 17X26 10 PK STRL BLUE (TOWEL DISPOSABLE) ×3 IMPLANT
TRAY FOLEY CATH 16FRSI W/METER (SET/KITS/TRAYS/PACK) IMPLANT
WATER STERILE IRR 1000ML POUR (IV SOLUTION) IMPLANT

## 2015-06-09 NOTE — Anesthesia Procedure Notes (Addendum)
Procedure Name: MAC Date/Time: 06/09/2015 4:28 PM Performed by: Layla Maw Pre-anesthesia Checklist: Timeout performed, Patient identified, Emergency Drugs available, Suction available and Patient being monitored Patient Re-evaluated:Patient Re-evaluated prior to inductionOxygen Delivery Method: Simple face mask Preoxygenation: Pre-oxygenation with 100% oxygen Number of attempts: 1    Spinal Patient location during procedure: OR Staffing Anesthesiologist: Suzette Battiest Performed by: anesthesiologist  Preanesthetic Checklist Completed: patient identified, site marked, surgical consent, pre-op evaluation, timeout performed, IV checked, risks and benefits discussed and monitors and equipment checked Spinal Block Patient position: sitting Prep: DuraPrep Patient monitoring: heart rate, continuous pulse ox and blood pressure Approach: right paramedian Location: L4-5 Injection technique: single-shot Needle Needle type: Quincke  Needle gauge: 22 G Needle length: 9 cm Additional Notes Expiration date of kit checked and confirmed. Patient tolerated procedure well, without complications. Multiple attempts midline without success. CSF obtained with aspiration before and after injection LA from right paramedian approach.   Procedure Name: Intubation Date/Time: 06/09/2015 5:10 PM Performed by: Layla Maw Pre-anesthesia Checklist: Patient identified, Patient being monitored, Timeout performed, Emergency Drugs available and Suction available Patient Re-evaluated:Patient Re-evaluated prior to inductionOxygen Delivery Method: Circle System Utilized Preoxygenation: Pre-oxygenation with 100% oxygen Intubation Type: IV induction Ventilation: Mask ventilation without difficulty Laryngoscope Size: Miller and 3 Grade View: Grade III Tube type: Oral Tube size: 7.5 mm Number of attempts: 1 Airway Equipment and Method: Stylet Placement Confirmation: ETT inserted  through vocal cords under direct vision,  positive ETCO2 and breath sounds checked- equal and bilateral Secured at: 23 cm Tube secured with: Tape Dental Injury: Teeth and Oropharynx as per pre-operative assessment

## 2015-06-09 NOTE — Anesthesia Preprocedure Evaluation (Signed)
Anesthesia Evaluation  Patient identified by MRN, date of birth, ID band Patient awake    Reviewed: Allergy & Precautions, NPO status , Patient's Chart, lab work & pertinent test results  Airway Mallampati: II  TM Distance: >3 FB Neck ROM: Full    Dental no notable dental hx.    Pulmonary neg pulmonary ROS,    Pulmonary exam normal breath sounds clear to auscultation       Cardiovascular negative cardio ROS Normal cardiovascular exam Rhythm:Regular Rate:Normal     Neuro/Psych negative neurological ROS  negative psych ROS   GI/Hepatic negative GI ROS, Neg liver ROS,   Endo/Other  negative endocrine ROS  Renal/GU negative Renal ROS  negative genitourinary   Musculoskeletal negative musculoskeletal ROS (+)   Abdominal   Peds negative pediatric ROS (+)  Hematology negative hematology ROS (+)   Anesthesia Other Findings   Reproductive/Obstetrics negative OB ROS                             Anesthesia Physical Anesthesia Plan  ASA: II  Anesthesia Plan: Spinal   Post-op Pain Management:    Induction: Intravenous  Airway Management Planned: Simple Face Mask  Additional Equipment:   Intra-op Plan:   Post-operative Plan:   Informed Consent: I have reviewed the patients History and Physical, chart, labs and discussed the procedure including the risks, benefits and alternatives for the proposed anesthesia with the patient or authorized representative who has indicated his/her understanding and acceptance.   Dental advisory given  Plan Discussed with: CRNA and Surgeon  Anesthesia Plan Comments:         Anesthesia Quick Evaluation  

## 2015-06-09 NOTE — Transfer of Care (Signed)
Immediate Anesthesia Transfer of Care Note  Patient: Jeff Murphy  Procedure(s) Performed: Procedure(s): RIGHT TOTAL HIP ARTHROPLASTY ANTERIOR APPROACH (Right)  Patient Location: PACU  Anesthesia Type:General and Spinal  Level of Consciousness: awake, alert , oriented and patient cooperative  Airway & Oxygen Therapy: Patient Spontanous Breathing and Patient connected to nasal cannula oxygen  Post-op Assessment: Report given to RN and Post -op Vital signs reviewed and stable  Post vital signs: Reviewed and stable  Last Vitals:  Filed Vitals:   06/09/15 1405  BP: 144/97  Pulse: 62  Temp: 36.9 C  Resp: 20    Complications: No apparent anesthesia complications

## 2015-06-09 NOTE — H&P (Signed)
TOTAL HIP ADMISSION H&P  Patient is admitted for right total hip arthroplasty.  Subjective:  Chief Complaint: right hip pain  HPI: Jeff Murphy, 54 y.o. male, has a history of pain and functional disability in the right hip(s) due to arthritis and patient has failed non-surgical conservative treatments for greater than 12 weeks to include NSAID's and/or analgesics, corticosteriod injections, flexibility and strengthening excercises and activity modification.  Onset of symptoms was gradual starting 3 years ago with gradually worsening course since that time.The patient noted no past surgery on the right hip(s).  Patient currently rates pain in the right hip at 9 out of 10 with activity. Patient has night pain, worsening of pain with activity and weight bearing, pain that interfers with activities of daily living and pain with passive range of motion. Patient has evidence of subchondral sclerosis, periarticular osteophytes and joint space narrowing by imaging studies. This condition presents safety issues increasing the risk of falls.  There is no current active infection.  Patient Active Problem List   Diagnosis Date Noted  . Osteoarthritis of right hip 06/09/2015  . Right hip pain 01/22/2014  . Right knee pain 10/26/2012   Past Medical History  Diagnosis Date  . Arthritis   . Skin cancer     removed from chest and behind left ear    Past Surgical History  Procedure Laterality Date  . Knee surgery Left     meniscal debridement  . Shoulder surgery Right     tenodesis - ?biceps  . Knee arthroscopy Bilateral   . Colonoscopy      No prescriptions prior to admission   Allergies  Allergen Reactions  . Aspirin Other (See Comments)    Stomach or kidney pain  . Neosporin Original [Bacitracin-Neomycin-Polymyxin] Rash  . Other Swelling    Tree nuts cause mild throat swelling  . Polysporin [Bacitracin-Polymyxin B] Rash    Social History  Substance Use Topics  . Smoking status: Never  Smoker   . Smokeless tobacco: Not on file  . Alcohol Use: 0.0 oz/week    0 Standard drinks or equivalent per week     Comment: social    Family History  Problem Relation Age of Onset  . Hypertension Father   . Hypertension Brother   . Diabetes Neg Hx   . Heart attack Neg Hx   . Hyperlipidemia Neg Hx   . Sudden death Neg Hx      Review of Systems  Musculoskeletal: Positive for joint pain.  All other systems reviewed and are negative.   Objective:  Physical Exam  Constitutional: He is oriented to person, place, and time. He appears well-developed and well-nourished.  HENT:  Head: Normocephalic and atraumatic.  Eyes: EOM are normal. Pupils are equal, round, and reactive to light.  Neck: Normal range of motion. Neck supple.  Cardiovascular: Normal rate and regular rhythm.   Respiratory: Effort normal and breath sounds normal.  GI: Soft. Bowel sounds are normal.  Musculoskeletal:       Right hip: He exhibits decreased range of motion, decreased strength, tenderness and bony tenderness.  Neurological: He is alert and oriented to person, place, and time.  Skin: Skin is warm and dry.  Psychiatric: He has a normal mood and affect.    Vital signs in last 24 hours:    Labs:   Estimated body mass index is 26.97 kg/(m^2) as calculated from the following:   Height as of 10/23/14: 5\' 6"  (1.676 m).   Weight as of  10/23/14: 75.751 kg (167 lb).   Imaging Review Plain radiographs demonstrate moderate degenerative joint disease of the right hip(s). The bone quality appears to be excellent for age and reported activity level.  Assessment/Plan:  End stage arthritis, right hip(s)  The patient history, physical examination, clinical judgement of the provider and imaging studies are consistent with end stage degenerative joint disease of the right hip(s) and total hip arthroplasty is deemed medically necessary. The treatment options including medical management, injection therapy,  arthroscopy and arthroplasty were discussed at length. The risks and benefits of total hip arthroplasty were presented and reviewed. The risks due to aseptic loosening, infection, stiffness, dislocation/subluxation,  thromboembolic complications and other imponderables were discussed.  The patient acknowledged the explanation, agreed to proceed with the plan and consent was signed. Patient is being admitted for inpatient treatment for surgery, pain control, PT, OT, prophylactic antibiotics, VTE prophylaxis, progressive ambulation and ADL's and discharge planning.The patient is planning to be discharged home with home health services

## 2015-06-09 NOTE — Brief Op Note (Signed)
06/09/2015  6:06 PM  PATIENT:  Jeff Murphy  54 y.o. male  PRE-OPERATIVE DIAGNOSIS:  Osteoarthritis right hip  POST-OPERATIVE DIAGNOSIS:  Osteoarthritis right hip  PROCEDURE:  Procedure(s): RIGHT TOTAL HIP ARTHROPLASTY ANTERIOR APPROACH (Right)  SURGEON:  Surgeon(s) and Role:    * Mcarthur Rossetti, MD - Primary  PHYSICIAN ASSISTANT: Benita Stabile, PA-C  ANESTHESIA:   spinal and general  EBL:  Total I/O In: 1000 [I.V.:1000] Out: -   BLOOD ADMINISTERED:none  DRAINS: none   LOCAL MEDICATIONS USED:  NONE  SPECIMEN:  No Specimen  DISPOSITION OF SPECIMEN:  N/A  COUNTS:  YES  TOURNIQUET:  * No tourniquets in log *  DICTATION: .Other Dictation: Dictation Number [id number]  PLAN OF CARE: Admit to inpatient   PATIENT DISPOSITION:  PACU - hemodynamically stable.   Delay start of Pharmacological VTE agent (>24hrs) due to surgical blood loss or risk of bleeding: no

## 2015-06-10 ENCOUNTER — Encounter (HOSPITAL_COMMUNITY): Payer: Self-pay | Admitting: Orthopaedic Surgery

## 2015-06-10 LAB — CBC
HEMATOCRIT: 41 % (ref 39.0–52.0)
HEMOGLOBIN: 13.7 g/dL (ref 13.0–17.0)
MCH: 30.1 pg (ref 26.0–34.0)
MCHC: 33.4 g/dL (ref 30.0–36.0)
MCV: 90.1 fL (ref 78.0–100.0)
Platelets: 217 10*3/uL (ref 150–400)
RBC: 4.55 MIL/uL (ref 4.22–5.81)
RDW: 13 % (ref 11.5–15.5)
WBC: 10.2 10*3/uL (ref 4.0–10.5)

## 2015-06-10 LAB — BASIC METABOLIC PANEL
Anion gap: 11 (ref 5–15)
BUN: 16 mg/dL (ref 6–20)
CO2: 24 mmol/L (ref 22–32)
Calcium: 8.9 mg/dL (ref 8.9–10.3)
Chloride: 99 mmol/L — ABNORMAL LOW (ref 101–111)
Creatinine, Ser: 0.8 mg/dL (ref 0.61–1.24)
GFR calc Af Amer: >60 mL/min (ref >60)
GFR calc non Af Amer: >60 mL/min (ref >60)
Glucose, Bld: 144 mg/dL — ABNORMAL HIGH (ref 65–99)
Potassium: 4.2 mmol/L (ref 3.5–5.1)
Sodium: 134 mmol/L — ABNORMAL LOW (ref 135–145)

## 2015-06-10 MED ORDER — TAMSULOSIN HCL 0.4 MG PO CAPS
0.4000 mg | ORAL_CAPSULE | Freq: Once | ORAL | Status: AC
  Administered 2015-06-10: 0.4 mg via ORAL
  Filled 2015-06-10: qty 1

## 2015-06-10 MED ORDER — TRAMADOL HCL 50 MG PO TABS: 100.0000 mg | ORAL_TABLET | Freq: Four times a day (QID) | ORAL | Status: DC | PRN

## 2015-06-10 MED ORDER — METHOCARBAMOL 500 MG PO TABS
500.0000 mg | ORAL_TABLET | Freq: Four times a day (QID) | ORAL | Status: DC | PRN
Start: 2015-06-10 — End: 2016-02-02

## 2015-06-10 MED ORDER — RIVAROXABAN 10 MG PO TABS: 10.0000 mg | ORAL_TABLET | Freq: Every day | ORAL | Status: DC

## 2015-06-10 NOTE — Progress Notes (Signed)
Jeff Murphy discharged home per MD order. Discharge instructions reviewed and discussed with patient. All questions and concerns answered. Copy of instructions and scripts given to patient. IV removed.  Patient escorted to car by staff in a wheelchair. No distress noted upon discharge.   [name] 06/10/2015 3:29 PM

## 2015-06-10 NOTE — Progress Notes (Addendum)
Pt DTV by 3:45am.Bladder scanned 562ml. Pt attempted in urinal and unsuccessful. RN and NT attempted a straight intermittent cath x2. Both times unsuccessful. Catheter seemed to coil up and cause extreme discomfort to patient. Will page MD for further instructions.  Md on call Yates instructed to see if certified nurse can place a coude cath to drain bladder at this time. 6N called for assistance.   6:10am 4W called for assistance. Will pass to oncoming day nurse.

## 2015-06-10 NOTE — Progress Notes (Signed)
Utilization review completed.  

## 2015-06-10 NOTE — Anesthesia Postprocedure Evaluation (Signed)
Anesthesia Post Note  Patient: Jeff Murphy  Procedure(s) Performed: Procedure(s) (LRB): RIGHT TOTAL HIP ARTHROPLASTY ANTERIOR APPROACH (Right)  Patient location during evaluation: PACU Anesthesia Type: General Level of consciousness: awake and alert Pain management: pain level controlled Vital Signs Assessment: post-procedure vital signs reviewed and stable Respiratory status: spontaneous breathing, nonlabored ventilation, respiratory function stable and patient connected to nasal cannula oxygen Cardiovascular status: blood pressure returned to baseline and stable Postop Assessment: no signs of nausea or vomiting Anesthetic complications: no    Last Vitals:  Filed Vitals:   06/09/15 2051 06/10/15 0056  BP: 114/66 118/58  Pulse: 64 62  Temp: 36.6 C 37 C  Resp: 16 16    Last Pain:  Filed Vitals:   06/10/15 0057  PainSc: 7                  Daniela Siebers DAVID

## 2015-06-10 NOTE — Op Note (Signed)
NAMESCHAWN, TANKSLEY               ACCOUNT NO.:  [id number]  MEDICAL RECORD NO.:  [id number]  LOCATION:  5N27C                        FACILITY:  [facility]  PHYSICIAN:  Lind Guest. Ninfa Linden, M.D.DATE OF BIRTH:  Jan 26, 1961  DATE OF PROCEDURE:  06/09/2015 DATE OF DISCHARGE:                              OPERATIVE REPORT   PREOPERATIVE DIAGNOSES:  Osteoarthritis and degenerative joint disease of right hip.  POSTOPERATIVE DIAGNOSES:  Osteoarthritis and degenerative joint disease of right hip.  PROCEDURE:  Right total hip arthroplasty through direct anterior approach.  IMPLANTS:  DePuy Sector Gription acetabular component size 52 with a single screw, size 36+ 4 polyethylene liner, size 12 Corail femoral component with varus offset (KLA), size 36+ 5 ceramic hip ball.  SURGEON:  Lind Guest. Ninfa Linden, M.D.  ASSISTANT:  Erskine Emery, PA-C  ANESTHESIA: 1. Attempted spinal. 2. General.  ANTIBIOTICS:  2 g of IV Ancef.  BLOOD LOSS:  About 100-200 mL.  COMPLICATIONS:  None.  INDICATIONS:  Mr. Rexroth is a 54 year old gentleman, well known to me. He is an avid Chiropodist and developed severe femoroacetabular impingement.  This was developed into osteoarthritis to the superior aspect of the hip joint.  His pain is daily, it is affected his mobility, his activities of daily living, and his quality of life.  He is at the point with failure of all forms of conservative treatment, he wished to proceed with the total hip arthroplasty.  He said without this, his pain is daily and detrimentally affected his quality of life and his activities of daily living.  He understands with surgery, the risks of acute blood loss anemia, nerve and vessel injury, fracture, infection, dislocation and DVT.  He understands our goals of decreased pain, improved mobility, and overall improved quality of life.  PROCEDURE DESCRIPTION:  After informed consent was obtained, appropriate right hip was marked.   He was brought to the operating room and spinal anesthesia was obtained.  Then, he was laid supine on the Hana fracture table with the perineal post in place and both legs in inline skeletal traction devices, but no traction applied.  We then prepped his right hip with DuraPrep and sterile drapes.  Time-out was called, he was identified as correct patient and correct right hip.  We then tested this skin and he still felt pain sensation with forceps, so general anesthesia was undertaken.  We then proceeded with the case.  We made an incision inferior and posterior to the anterior superior iliac spine and carried this obliquely down the leg.  We dissected down the tensor fascia lata muscle and tensor fascia was then divided longitudinally, so I could proceed with a direct anterior approach to the hip.  We identified and cauterized the lateral femoral circumflex vessels and then identified the hip capsule.  We placed the Cobra retractors around the lateral and medial femoral neck.  We then opened up the hip capsule in L-type format placing the Cobra retractors within the hip capsule. We then made our femoral neck cut proximal to the lesser trochanter using an oscillating saw and completed this with an osteotome.  We placed a corkscrew guide in the femoral head and removed the femoral  head in its entirety and found a large portion of the devoid of cartilage completely hard as marble.  We then cleaned the acetabulum and remnants of the acetabular labrum.  I placed a bent Hohmann over the medial acetabular rim and did find three loose bodies in the acetabulum. I then began reaming under direct visualization from a size 42 reamer up to a size 52 with all reamers under direct visualization and last reamer also under direct fluoroscopy, so I could obtain my depth of reamer our inclination and anteversion.  Once I was pleased with this, I placed the real DePuy Sector Gription acetabular component of  size 52 and a single screw.  I placed a real 36+ 4 polyethylene liner for that sized acetabular component.  Attention was then turned to the femur.  With the leg externally rotated to 100 degrees extended and adducted, and we were able to place a Mueller retractor medially and a Hohmann retractor behind the greater trochanter.  I released the lateral joint capsule and then brought the leg up higher.  I used a box cutting osteotome to enter the femoral canal and a rongeur to lateralize.  I then began broaching using the Corail broaching system from a size 8 broach up to a size 12. Based on his anatomy, we trialed a varus offset femoral neck and a 36+ 1.5 hip ball.  We brought the leg back over and up with traction and internal rotation reducing the pelvis and I was pleased with range of motion and stability, but I felt like he needed a little bit more offset and leg length.  We dislocated the hip and removed the trial components. I then placed the real Corail femoral component size 12 with varus offset followed by the real 36+ 5 ceramic hip ball.  We reduced this into the acetabulum.  We were pleased with leg length, offset and stability.  We then removed all instrumentation and irrigated the hip with normal saline solution using the pulsatile lavage.  We closed the joint capsule with interrupted #1 Ethibond suture followed by running #1 Vicryl in the tensor fascia, 0 Vicryl in the deep tissue, 2-0 Vicryl in the subcutaneous tissue, 4-0 Monocryl subcuticular stitch and Steri- Strips on the skin.  An Aquacel dressing was applied.  He was then taken off the Hana table, awakened, extubated and taken to the recovery room in stable condition.  All final counts were correct.  There were no complications noted.  Of note, Erskine Emery, PA-C assisted during the entire case.  His assistance was crucial for facilitating all aspects of this case.     Lind Guest. Ninfa Linden,  M.D.     CYB/MEDQ  D:  06/09/2015  T:  06/10/2015  Job:  [id number]

## 2015-06-10 NOTE — Progress Notes (Signed)
Subjective: 1 Day Post-Op Procedure(s) (LRB): RIGHT TOTAL HIP ARTHROPLASTY ANTERIOR APPROACH (Right) Patient reports pain as moderate.    Objective: Vital signs in last 24 hours: Temp:  [97.4 F (36.3 C)-98.6 F (37 C)] 97.4 F (36.3 C) (12/07 0436) Pulse Rate:  [52-92] 71 (12/07 0436) Resp:  [12-22] 16 (12/07 0436) BP: (105-144)/(58-97) 123/77 mmHg (12/07 0436) SpO2:  [97 %-100 %] 97 % (12/07 0436) Weight:  [79.153 kg (174 lb 8 oz)] 79.153 kg (174 lb 8 oz) (12/06 1405)  Intake/Output from previous day: 12/06 0701 - 12/07 0700 In: 2773.8 [P.O.:510; I.V.:2163.8; IV Piggyback:100] Out: 25 [Urine:25] Intake/Output this shift:     Recent Labs  06/10/15 0402  HGB 13.7    Recent Labs  06/10/15 0402  WBC 10.2  RBC 4.55  HCT 41.0  PLT 217    Recent Labs  06/10/15 0402  NA 134*  K 4.2  CL 99*  CO2 24  BUN 16  CREATININE 0.80  GLUCOSE 144*  CALCIUM 8.9   No results for input(s): LABPT, INR in the last 72 hours.  Sensation intact distally Intact pulses distally Dorsiflexion/Plantar flexion intact Incision: dressing C/D/I  Assessment/Plan: 1 Day Post-Op Procedure(s) (LRB): RIGHT TOTAL HIP ARTHROPLASTY ANTERIOR APPROACH (Right) Up with therapy Discharge home with home health this afternoon/evening vs tomorrow  Mcarthur Rossetti 06/10/2015, 7:02 AM

## 2015-06-10 NOTE — Progress Notes (Signed)
Pt coude I&O cath for 600 ml at 0730. Pt tolerated well. Kennieth Francois, RN

## 2015-06-10 NOTE — Care Management Note (Signed)
Case Management Note  Patient Details  Name: Jeff Murphy MRN: [id number] Date of Birth: 10-03-1960  Subjective/Objective:       S/p right total hip arthroplasty             Action/Plan: Set up with [facility] for HHPT by MD office.Spoke with patient, he is not sure that he wants HHPT due to his wife being an Therapist, sports and helped him with home therapy in the past. Patient aware that [facility] has been set up. Patient stated that his wife will be able to assist him after discharge and that he only needs a 3N1. Contacted James at [facility] and requested 3N1 be delivered to patient's room.    Expected Discharge Date:                  Expected Discharge Plan:  [place]  In-House Referral:  NA  Discharge planning Services  CM Consult  Post Acute Care Choice:  Durable Medical Equipment, Home Health Choice offered to:  Patient  DME Arranged:  3-N-1 DME Agency:  [place]:  PT [place]:  [place]  Status of Service:  Completed, signed off  Medicare Important Message Given:    Date Medicare IM Given:    Medicare IM give by:    Date Additional Medicare IM Given:    Additional Medicare Important Message give by:     If discussed at [place] of Stay Meetings, dates discussed:    Additional Comments:  Nila Nephew, RN 06/10/2015, 1:23 PM

## 2015-06-10 NOTE — Discharge Instructions (Signed)
INSTRUCTIONS AFTER JOINT REPLACEMENT  ° °o Remove items at home which could result in a fall. This includes throw rugs or furniture in walking pathways °o ICE to the affected joint every three hours while awake for 30 minutes at a time, for at least the first 3-5 days, and then as needed for pain and swelling.  Continue to use ice for pain and swelling. You may notice swelling that will progress down to the foot and ankle.  This is normal after surgery.  Elevate your leg when you are not up walking on it.   °o Continue to use the breathing machine you got in the hospital (incentive spirometer) which will help keep your temperature down.  It is common for your temperature to cycle up and down following surgery, especially at night when you are not up moving around and exerting yourself.  The breathing machine keeps your lungs expanded and your temperature down. ° ° °DIET:  As you were doing prior to hospitalization, we recommend a well-balanced diet. ° °DRESSING / WOUND CARE / SHOWERING ° °Keep the surgical dressing until follow up.  The dressing is water proof, so you can shower without any extra covering.  IF THE DRESSING FALLS OFF or the wound gets wet inside, change the dressing with sterile gauze.  Please use good hand washing techniques before changing the dressing.  Do not use any lotions or creams on the incision until instructed by your surgeon.   ° °ACTIVITY ° °o Increase activity slowly as tolerated, but follow the weight bearing instructions below.   °o No driving for 6 weeks or until further direction given by your physician.  You cannot drive while taking narcotics.  °o No lifting or carrying greater than 10 lbs. until further directed by your surgeon. °o Avoid periods of inactivity such as sitting longer than an hour when not asleep. This helps prevent blood clots.  °o You may return to work once you are authorized by your doctor.  ° ° ° °WEIGHT BEARING  ° °Weight bearing as tolerated with assist  device (walker, cane, etc) as directed, use it as long as suggested by your surgeon or therapist, typically at least 4-6 weeks. ° ° °EXERCISES ° °Results after joint replacement surgery are often greatly improved when you follow the exercise, range of motion and muscle strengthening exercises prescribed by your doctor. Safety measures are also important to protect the joint from further injury. Any time any of these exercises cause you to have increased pain or swelling, decrease what you are doing until you are comfortable again and then slowly increase them. If you have problems or questions, call your caregiver or physical therapist for advice.  ° °Rehabilitation is important following a joint replacement. After just a few days of immobilization, the muscles of the leg can become weakened and shrink (atrophy).  These exercises are designed to build up the tone and strength of the thigh and leg muscles and to improve motion. Often times heat used for twenty to thirty minutes before working out will loosen up your tissues and help with improving the range of motion but do not use heat for the first two weeks following surgery (sometimes heat can increase post-operative swelling).  ° °These exercises can be done on a training (exercise) mat, on the floor, on a table or on a bed. Use whatever works the best and is most comfortable for you.    Use music or television while you are exercising so that   the exercises are a pleasant break in your day. This will make your life better with the exercises acting as a break in your routine that you can look forward to.   Perform all exercises about fifteen times, three times per day or as directed.  You should exercise both the operative leg and the other leg as well. ° °Exercises include: °  °• Quad Sets - Tighten up the muscle on the front of the thigh (Quad) and hold for 5-10 seconds.   °• Straight Leg Raises - With your knee straight (if you were given a brace, keep it on),  lift the leg to 60 degrees, hold for 3 seconds, and slowly lower the leg.  Perform this exercise against resistance later as your leg gets stronger.  °• Leg Slides: Lying on your back, slowly slide your foot toward your buttocks, bending your knee up off the floor (only go as far as is comfortable). Then slowly slide your foot back down until your leg is flat on the floor again.  °• Angel Wings: Lying on your back spread your legs to the side as far apart as you can without causing discomfort.  °• Hamstring Strength:  Lying on your back, push your heel against the floor with your leg straight by tightening up the muscles of your buttocks.  Repeat, but this time bend your knee to a comfortable angle, and push your heel against the floor.  You may put a pillow under the heel to make it more comfortable if necessary.  ° °A rehabilitation program following joint replacement surgery can speed recovery and prevent re-injury in the future due to weakened muscles. Contact your doctor or a physical therapist for more information on knee rehabilitation.  ° ° °CONSTIPATION ° °Constipation is defined medically as fewer than three stools per week and severe constipation as less than one stool per week.  Even if you have a regular bowel pattern at home, your normal regimen is likely to be disrupted due to multiple reasons following surgery.  Combination of anesthesia, postoperative narcotics, change in appetite and fluid intake all can affect your bowels.  ° °YOU MUST use at least one of the following options; they are listed in order of increasing strength to get the job done.  They are all available over the counter, and you may need to use some, POSSIBLY even all of these options:   ° °Drink plenty of fluids (prune juice may be helpful) and high fiber foods °Colace 100 mg by mouth twice a day  °Senokot for constipation as directed and as needed Dulcolax (bisacodyl), take with full glass of water  °Miralax (polyethylene glycol)  once or twice a day as needed. ° °If you have tried all these things and are unable to have a bowel movement in the first 3-4 days after surgery call either your surgeon or your primary doctor.   ° °If you experience loose stools or diarrhea, hold the medications until you stool forms back up.  If your symptoms do not get better within 1 week or if they get worse, check with your doctor.  If you experience "the worst abdominal pain ever" or develop nausea or vomiting, please contact the office immediately for further recommendations for treatment. ° ° °ITCHING:  If you experience itching with your medications, try taking only a single pain pill, or even half a pain pill at a time.  You can also use Benadryl over the counter for itching or also to   help with sleep.   TED HOSE STOCKINGS:  Use stockings on both legs until for at least 2 weeks or as directed by physician office. They may be removed at night for sleeping.  MEDICATIONS:  See your medication summary on the After Visit Summary that nursing will review with you.  You may have some home medications which will be placed on hold until you complete the course of blood thinner medication.  It is important for you to complete the blood thinner medication as prescribed.  PRECAUTIONS:  If you experience chest pain or shortness of breath - call 911 immediately for transfer to the hospital emergency department.   If you develop a fever greater that 101 F, purulent drainage from wound, increased redness or drainage from wound, foul odor from the wound/dressing, or calf pain - CONTACT YOUR SURGEON.                                                   FOLLOW-UP APPOINTMENTS:  If you do not already have a post-op appointment, please call the office for an appointment to be seen by your surgeon.  Guidelines for how soon to be seen are listed in your After Visit Summary, but are typically between 1-4 weeks after surgery.  OTHER INSTRUCTIONS:   Knee  Replacement:  Do not place pillow under knee, focus on keeping the knee straight while resting. CPM instructions: 0-90 degrees, 2 hours in the morning, 2 hours in the afternoon, and 2 hours in the evening. Place foam block, curve side up under heel at all times except when in CPM or when walking.  DO NOT modify, tear, cut, or change the foam block in any way.  MAKE SURE YOU:   Understand these instructions.   Get help right away if you are not doing well or get worse.    Thank you for letting us be a part of your medical care team.  It is a privilege we respect greatly.  We hope these instructions will help you stay on track for a fast and full recovery!   Information on my medicine - XARELTO (Rivaroxaban)  This medication education was reviewed with me or my healthcare representative as part of my discharge preparation.  The pharmacist that spoke with me during my hospital stay was:  Tad Moore, [facility]  Why was Xarelto prescribed for you? Xarelto was prescribed for you to reduce the risk of blood clots forming after orthopedic surgery. The medical term for these abnormal blood clots is venous thromboembolism (VTE).  What do you need to know about xarelto ? Take your Xarelto ONCE DAILY at the same time every day. You may take it either with or without food.  If you have difficulty swallowing the tablet whole, you may crush it and mix in applesauce just prior to taking your dose.  Take Xarelto exactly as prescribed by your doctor and DO NOT stop taking Xarelto without talking to the doctor who prescribed the medication.  Stopping without other VTE prevention medication to take the place of Xarelto may increase your risk of developing a clot.  After discharge, you should have regular check-up appointments with your healthcare provider that is prescribing your Xarelto.    What do you do if you miss a dose? If you miss a dose, take it as soon as you  remember on the same day then  continue your regularly scheduled once daily regimen the next day. Do not take two doses of Xarelto® on the same day.  ° °Important Safety Information °A possible side effect of Xarelto® is bleeding. You should call your healthcare provider right away if you experience any of the following: °? Bleeding from an injury or your nose that does not stop. °? Unusual colored urine (red or dark brown) or unusual colored stools (red or black). °? Unusual bruising for unknown reasons. °? A serious fall or if you hit your head (even if there is no bleeding). ° °Some medicines may interact with Xarelto® and might increase your risk of bleeding while on Xarelto®. To help avoid this, consult your healthcare provider or pharmacist prior to using any new prescription or non-prescription medications, including herbals, vitamins, non-steroidal anti-inflammatory drugs (NSAIDs) and supplements. ° °This website has more information on Xarelto®: www.xarelto.com. ° ° ° ° °

## 2015-06-10 NOTE — Evaluation (Signed)
Occupational Therapy Evaluation Patient Details Name: Jeff Murphy MRN: [id number] DOB: Sep 09, 1960 Today's Date: 06/10/2015    History of Present Illness Admitted for RTHA, WBAT Anterior Approach;  has past surgical history that includes Knee surgery (Left); Shoulder surgery (Right); Knee arthroscopy (Bilateral); and Colonoscopy.   Clinical Impression   Pt reports he was independent with ADLs and mobility PTA. Currently pt is overall supervision for ADLs and functional mobility with the exception of min assist for LB ADLs. All education complete; pt with no further questions or concerns for OT at this time. Pt planning to d/c home with 24/7 supervision from his wife. Pt ready to d/c from an OT standpoint; signing off at this time. Thank you for this referral.     Follow Up Recommendations  No OT follow up;Supervision - Intermittent    Equipment Recommendations  3 in 1 bedside comode    Recommendations for Other Services       Precautions / Restrictions Precautions Precautions: Fall Restrictions Weight Bearing Restrictions: Yes RLE Weight Bearing: Weight bearing as tolerated      Mobility Bed Mobility              General bed mobility comments: Pt OOB in chair  Transfers Overall transfer level: Needs assistance Equipment used: Rolling walker (2 wheeled) Transfers: Sit to/from Stand Sit to Stand: Supervision         General transfer comment: Supervision for safety. Good hand placement and technique. Sit to stand from chair x 1, BSC x 1    Balance Overall balance assessment: Needs assistance Sitting-balance support: Feet supported Sitting balance-Leahy Scale: Good     Standing balance support: During functional activity Standing balance-Leahy Scale: Fair                              ADL Overall ADL's : Needs assistance/impaired Eating/Feeding: Set up;Sitting   Grooming: Supervision/safety;Standing       Lower Body Bathing: Minimal  assistance;Sit to/from stand Lower Body Bathing Details (indicate cue type and reason): Educated on use of 3 in 1 in shower for energy conservation; pt verbalized understanding.     Lower Body Dressing: Minimal assistance;Sit to/from stand Lower Body Dressing Details (indicate cue type and reason): Pt able to reach R foot but unable to don R sock. Pt reports wife can assist as needed. Educated on attepmting every day prior to letting wife assist, compensatory strategies for LB ADLs; pt verbalized understanding. Toilet Transfer: Supervision/safety;Ambulation;BSC;RW (BSC over toilet) Toilet Transfer Details (indicate cue type and reason): Eduacted on use of 3 in 1 over toilet Toileting- Clothing Manipulation and Hygiene: Supervision/safety;Sit to/from stand   Tub/ Shower Transfer: Supervision/safety;Walk-in shower;Ambulation;3 in 1;Rolling walker Tub/Shower Transfer Details (indicate cue type and reason): Educated on walk in shower transfer technique; pt able to return demonstrate understanding.  Functional mobility during ADLs: Supervision/safety;Rolling walker General ADL Comments: No family present for OT eval. Educated on home safety, need for supervision during ADLs and mobility, edema management; pt verbalized understanding.     Vision     Perception     Praxis      Pertinent Vitals/Pain Pain Assessment: 0-10 Pain Score: 2  Pain Location: R hip Pain Descriptors / Indicators: Sore;Tightness Pain Intervention(s): Limited activity within patient's tolerance;Monitored during session;Ice applied     Hand Dominance     Extremity/Trunk Assessment Upper Extremity Assessment Upper Extremity Assessment: Overall WFL for tasks assessed   Lower Extremity Assessment Lower Extremity  Assessment: Defer to PT evaluation RLE Deficits / Details: Good muscle activation around hip   Cervical / Trunk Assessment Cervical / Trunk Assessment: Normal   Communication Communication Communication:  No difficulties   Cognition Arousal/Alertness: Awake/alert Behavior During Therapy: WFL for tasks assessed/performed Overall Cognitive Status: Within Functional Limits for tasks assessed                     General Comments       Exercises Exercises: Total Joint     Shoulder Instructions      Home Living Family/patient expects to be discharged to:: Private residence Living Arrangements: Spouse/significant other Available Help at Discharge: Family;Available 24 hours/day Type of Home: House Home Access: Stairs to enter Entrance Stairs-Number of Steps: 2 Entrance Stairs-Rails: None Home Layout: Two level;Bed/bath upstairs Alternate Level Stairs-Number of Steps: 12 Alternate Level Stairs-Rails: Right Bathroom Shower/Tub: Occupational psychologist: Standard Bathroom Accessibility: Yes How Accessible: Accessible via walker Home Equipment: Crutches          Prior Functioning/Environment Level of Independence: Independent             OT Diagnosis: Acute pain   OT Problem List:     OT Treatment/Interventions:      OT Goals(Current goals can be found in the care plan section) Acute Rehab OT Goals Patient Stated Goal: return to independence OT Goal Formulation: With patient  OT Frequency:     Barriers to D/C:            Co-evaluation              End of Session Equipment Utilized During Treatment: Gait belt;Rolling walker  Activity Tolerance: Patient tolerated treatment well Patient left: in chair;with call bell/phone within reach   Time: 1146-1200 OT Time Calculation (min): 14 min Charges:  OT General Charges $OT Visit: 1 Procedure OT Evaluation $Initial OT Evaluation Tier I: 1 Procedure G-Codes:     Binnie Kand M.S., OTR/L [phone]  06/10/2015, 12:09 PM

## 2015-06-10 NOTE — Evaluation (Signed)
Physical Therapy Evaluation Patient Details Name: Jeff Murphy MRN: [id number] DOB: 01/20/1961 Today's Date: 06/10/2015   History of Present Illness  Admitted for RTHA, WBAT Anterior Approach;  has past surgical history that includes Knee surgery (Left); Shoulder surgery (Right); Knee arthroscopy (Bilateral); and Colonoscopy.  Clinical Impression   Pt is s/p THA resulting in the deficits listed below (see PT Problem List).  Pt will benefit from skilled PT to increase their independence and safety with mobility to allow discharge to the venue listed below.   Managing quite well, and tells me he practiced with his crutches at home -- crutches will be adequate; no huge need for RW;   OK for dc home from PT standpoint      Follow Up Recommendations Other (comment) (Noted plan for HHPT follow-up in Ortho note, but pt does not see a big need and it seems he would rather not order HHPT; HHPT could be beneficial, but I also do believe he will be vigilant in his Home exercise program provided to him here) The potential need for Outpatient PT for return to sport can be addressed at Ortho follow-up appointments.     Equipment Recommendations  3in1 (PT)    Recommendations for Other Services OT consult     Precautions / Restrictions Precautions Precautions: None Restrictions Weight Bearing Restrictions: Yes RLE Weight Bearing: Weight bearing as tolerated      Mobility  Bed Mobility Overal bed mobility: Modified Independent             General bed mobility comments: Slow, but safe  Transfers Overall transfer level: Needs assistance Equipment used: Rolling walker (2 wheeled) Transfers: Sit to/from Stand Sit to Stand: Supervision         General transfer comment: Cues for technqiue and hand palcement  Ambulation/Gait Ambulation/Gait assistance: Supervision Ambulation Distance (Feet): 200 Feet Assistive device: Rolling walker (2 wheeled) Gait Pattern/deviations:  Step-through pattern     General Gait Details: Cues to self-monitor for activity tolerance; managing quite well  Stairs Stairs: Yes Stairs assistance: Supervision Stair Management: Two rails;Step to pattern;Forwards Number of Stairs: 5 General stair comments: Discussed sequencing and technique; Reports very confident with his abiilty to manage his stairs  Wheelchair Mobility    Modified Rankin (Stroke Patients Only)       Balance                                             Pertinent Vitals/Pain Pain Assessment: 0-10 Pain Score: 3  Pain Location: Tightness in R hip Pain Descriptors / Indicators: Tightness Pain Intervention(s): Monitored during session    Home Living Family/patient expects to be discharged to:: Private residence Living Arrangements: Spouse/significant other Available Help at Discharge: Family;Available 24 hours/day Type of Home: House Home Access: Stairs to enter Entrance Stairs-Rails: None Entrance Stairs-Number of Steps: 2 Home Layout: Two level;Bed/bath upstairs Home Equipment: Crutches      Prior Function Level of Independence: Independent               Hand Dominance        Extremity/Trunk Assessment   Upper Extremity Assessment: Overall WFL for tasks assessed           Lower Extremity Assessment: RLE deficits/detail RLE Deficits / Details: Good muscle activation around hip       Communication   Communication: No difficulties  Cognition Arousal/Alertness: Awake/alert  Behavior During Therapy: WFL for tasks assessed/performed Overall Cognitive Status: Within Functional Limits for tasks assessed                      General Comments      Exercises Total Joint Exercises Quad Sets: AROM;Right;10 reps Gluteal Sets: AROM;Both;5 reps Towel Squeeze: AROM;Both;10 reps Heel Slides: AAROM;AROM;Right;10 reps Hip ABduction/ADduction: AAROM;Right;10 reps      Assessment/Plan    PT Assessment  Patient needs continued PT services  PT Diagnosis Difficulty walking;Acute pain   PT Problem List Decreased strength;Decreased range of motion;Decreased balance;Decreased mobility;Decreased knowledge of use of DME;Pain  PT Treatment Interventions DME instruction;Gait training;Stair training;Functional mobility training;Therapeutic activities;Therapeutic exercise;Patient/family education   PT Goals (Current goals can be found in the Care Plan section) Acute Rehab PT Goals Patient Stated Goal: Back to [facility] PT Goal Formulation: With patient Time For Goal Achievement: 06/12/15 Potential to Achieve Goals: Good    Frequency 7X/week   Barriers to discharge        Co-evaluation               End of Session   Activity Tolerance: Patient tolerated treatment well Patient left: in chair;with call bell/phone within reach Nurse Communication: Mobility status         Time: [id number] PT Time Calculation (min) (ACUTE ONLY): 52 min   Charges:   PT Evaluation $Initial PT Evaluation Tier I: 1 Procedure PT Treatments $Gait Training: 8-22 mins $Therapeutic Exercise: 8-22 mins   PT G Codes:        Quin Hoop 06/10/2015, 11:49 AM  Roney Marion, [place] Pager [phone] Office [phone]

## 2015-06-10 NOTE — Discharge Summary (Signed)
Patient ID: Jeff Murphy MRN: [id number] DOB/AGE: [birth year] 53 y.o.  Admit date: 06/09/2015 Discharge date: 06/10/2015  Admission Diagnoses:  Principal Problem:   Osteoarthritis of right hip Active Problems:   Status post total replacement of right hip   Discharge Diagnoses:  Same  Past Medical History  Diagnosis Date  . Arthritis   . Skin cancer     removed from chest and behind left ear    Surgeries: Procedure(s): RIGHT TOTAL HIP ARTHROPLASTY ANTERIOR APPROACH on 06/09/2015   Consultants:    Discharged Condition: Improved  Hospital Course: Jeff Murphy is an 54 y.o. male who was admitted 06/09/2015 for operative treatment ofOsteoarthritis of right hip. Patient has severe unremitting pain that affects sleep, daily activities, and work/hobbies. After pre-op clearance the patient was taken to the operating room on 06/09/2015 and underwent  Procedure(s): RIGHT TOTAL HIP ARTHROPLASTY ANTERIOR APPROACH.    Patient was given perioperative antibiotics: Anti-infectives    Start     Dose/Rate Route Frequency Ordered Stop   06/09/15 2230  ceFAZolin (ANCEF) IVPB 1 g/50 mL premix     1 g 100 mL/hr over 30 Minutes Intravenous Every 6 hours 06/09/15 2046 06/10/15 0436   06/09/15 1200  ceFAZolin (ANCEF) IVPB 2 g/50 mL premix     2 g 100 mL/hr over 30 Minutes Intravenous To ShortStay Surgical 06/08/15 1408 06/09/15 1700       Patient was given sequential compression devices, early ambulation, and chemoprophylaxis to prevent DVT.  Patient benefited maximally from hospital stay and there were no complications.    Recent vital signs: Patient Vitals for the past 24 hrs:  BP Temp Temp src Pulse Resp SpO2 Height Weight  06/10/15 0436 123/77 mmHg 97.4 F (36.3 C) Oral 71 16 97 % - -  06/10/15 0056 (!) 118/58 mmHg 98.6 F (37 C) Oral 62 16 98 % - -  06/09/15 2051 114/66 mmHg 97.8 F (36.6 C) Oral 64 16 98 % - -  06/09/15 2030 112/71 mmHg 97.9 F (36.6 C) - 77 15 97 % - -  06/09/15  2015 106/70 mmHg - - (!) 57 15 98 % - -  06/09/15 2000 107/67 mmHg - - (!) 57 14 98 % - -  06/09/15 1945 105/66 mmHg - - (!) 52 12 98 % - -  06/09/15 1930 105/68 mmHg - - (!) 53 15 98 % - -  06/09/15 1915 108/69 mmHg - - (!) 58 16 100 % - -  06/09/15 1900 111/72 mmHg - - 62 13 99 % - -  06/09/15 1854 - - - 62 13 97 % - -  06/09/15 1845 115/68 mmHg - - 71 12 98 % - -  06/09/15 1844 - - - 80 17 98 % - -  06/09/15 1838 - - - 92 (!) 22 98 % - -  06/09/15 1832 - 98.1 F (36.7 C) - - - - - -  06/09/15 1830 116/77 mmHg - - 69 - 97 % - -  06/09/15 1405 (!) 144/97 mmHg 98.4 F (36.9 C) Oral 62 20 99 % 5\' 6"  (1.676 m) 79.153 kg (174 lb 8 oz)     Recent laboratory studies:  Recent Labs  06/10/15 0402  WBC 10.2  HGB 13.7  HCT 41.0  PLT 217  NA 134*  K 4.2  CL 99*  CO2 24  BUN 16  CREATININE 0.80  GLUCOSE 144*  CALCIUM 8.9     Discharge Medications:     Medication List  TAKE these medications        acetaminophen 650 MG CR tablet  Commonly known as:  TYLENOL  Take 1,300 mg by mouth every 8 (eight) hours as needed for pain.     methocarbamol 500 MG tablet  Commonly known as:  ROBAXIN  Take 1 tablet (500 mg total) by mouth every 6 (six) hours as needed for muscle spasms.     rivaroxaban 10 MG Tabs tablet  Commonly known as:  XARELTO  Take 1 tablet (10 mg total) by mouth daily with breakfast.     traMADol 50 MG tablet  Commonly known as:  ULTRAM  Take 2 tablets (100 mg total) by mouth every 6 (six) hours as needed.        Diagnostic Studies: Dg Hip Port Unilat With Pelvis 1v Right  06/09/2015  CLINICAL DATA:  Right hip arthroplasty, postop exam EXAM: DG HIP (WITH OR WITHOUT PELVIS) 1V PORT RIGHT COMPARISON:  06/09/2015 FINDINGS: Postop changes from a right hip arthroplasty. Components appear aligned in the frontal plane. Expected postoperative findings of the soft tissues. Bony pelvis and left hip appear intact. No acute osseous finding or hardware abnormality.  IMPRESSION: Status post right hip arthroplasty. Expected postoperative findings. Electronically Signed   By: Jerilynn Mages.  Shick M.D.   On: 06/09/2015 20:00   Dg Hip Operative Unilat With Pelvis Right  06/09/2015  CLINICAL DATA:  Right total hip arthroplasty. EXAM: OPERATIVE right HIP (WITH PELVIS IF PERFORMED) 1  VIEWS TECHNIQUE: Fluoroscopic spot image(s) were submitted for interpretation post-operatively. COMPARISON:  None. FINDINGS: Two AP intraoperative fluoroscopic images show a total right hip arthroplasty with well-seated and located prosthesis. No evidence of intraoperative fracture. IMPRESSION: Fluoroscopy for total right hip arthroplasty.  No adverse finding. Electronically Signed   By: Monte Fantasia M.D.   On: 06/09/2015 19:09    Disposition:  To home      Discharge Instructions    Discharge patient    Complete by:  As directed            Follow-up Information    Follow up with Mcarthur Rossetti, MD In 2 weeks.   Specialty:  Orthopedic Surgery   Contact information:   [address] [id number] [phone]        Signed: Mcarthur Rossetti 06/10/2015, 12:38 PM

## 2015-06-10 NOTE — Progress Notes (Signed)
Patient ID: Jeff Murphy, male   DOB: 1961/03/10, 54 y.o.   MRN: [id number] Looks great and has cleared therapy.  Can be discharged to home today.

## 2016-02-01 ENCOUNTER — Encounter: Payer: Self-pay | Admitting: Family Medicine

## 2016-02-01 ENCOUNTER — Ambulatory Visit (INDEPENDENT_AMBULATORY_CARE_PROVIDER_SITE_OTHER): Payer: BC Managed Care – PPO | Admitting: Family Medicine

## 2016-02-01 DIAGNOSIS — M7662 Achilles tendinitis, left leg: Secondary | ICD-10-CM

## 2016-02-01 NOTE — Patient Instructions (Signed)
You have Achilles Tendinopathy Ibuprofen 600mg  three times a day with food OR aleve 2 tabs twice a day with food for pain and inflammation for 7-10 days then as needed. Calf raises 3 sets of 10 on level ground once a day first. When these are easy, can do them one legged 3 sets of 10. Finally advance to doing them on a step. Can add heel walks, toe walks forward and backward as well Ice bucket 10-15 minutes at end of day - can ice 3-4 times a day. Avoid uneven ground, hills as much as possible. Heel lifts in shoes or shoes with a natural heel lift. Consider physical therapy, orthotics, nitro patches if not improving as expected. Follow up in 6 weeks. Call me if you want to do patches before then.

## 2016-02-02 DIAGNOSIS — M7662 Achilles tendinitis, left leg: Secondary | ICD-10-CM | POA: Insufficient documentation

## 2016-02-02 NOTE — Assessment & Plan Note (Signed)
significant swelling, some partial tearing visualized on ultrasound.  He will start with home exercises, heel lifts, icing, nsaids.  Avoid flat shoes, barefoot walking, inclines.  Consider nitro patches, PT, orthotics if not improving as expected.  F/u in 6 weeks.

## 2016-02-02 NOTE — Progress Notes (Signed)
PCP: Jerlyn Ly, MD  Subjective:   HPI: Patient is a 55 y.o. male here for left heel pain.  Patient states he's had left heel pain mainly the past couple days with associated swelling. Noticed more when playing soccer with son for about 20 minutes. Has tried icing, nsaids past couple days. Wearing flat sandals because pain worse when something rubs on this area. Pain is dull, 3/10 and worse with walking. No skin changes, numbness.  Past Medical History:  Diagnosis Date  . Arthritis   . Skin cancer    removed from chest and behind left ear    No current outpatient prescriptions on file prior to visit.   No current facility-administered medications on file prior to visit.     Past Surgical History:  Procedure Laterality Date  . COLONOSCOPY    . KNEE ARTHROSCOPY Bilateral   . KNEE SURGERY Left    meniscal debridement  . SHOULDER SURGERY Right    tenodesis - ?biceps  . TOTAL HIP ARTHROPLASTY Right 06/09/2015   Procedure: RIGHT TOTAL HIP ARTHROPLASTY ANTERIOR APPROACH;  Surgeon: Mcarthur Rossetti, MD;  Location: [facility];  Service: Orthopedics;  Laterality: Right;    Allergies  Allergen Reactions  . Aspirin Other (See Comments)    Stomach or kidney pain  . Neosporin Original [Bacitracin-Neomycin-Polymyxin] Rash  . Other Swelling    Tree nuts cause mild throat swelling  . Polysporin [Bacitracin-Polymyxin B] Rash    Social History   Social History  . Marital status: Married    Spouse name: N/A  . Number of children: N/A  . Years of education: N/A   Occupational History  . Not on file.   Social History Main Topics  . Smoking status: Never Smoker  . Smokeless tobacco: Never Used  . Alcohol use 0.0 oz/week     Comment: social  . Drug use: Unknown  . Sexual activity: Not on file   Other Topics Concern  . Not on file   Social History Narrative  . No narrative on file    Family History  Problem Relation Age of Onset  . Hypertension Father   .  Hypertension Brother   . Diabetes Neg Hx   . Heart attack Neg Hx   . Hyperlipidemia Neg Hx   . Sudden death Neg Hx     BP (!) 141/89   Pulse 62   Ht 5\' 6"  (1.676 m)   Wt 168 lb (76.2 kg)   BMI 27.12 kg/m   Review of Systems: See HPI above.    Objective:  Physical Exam:  Gen: NAD, comfortable in exam room  Left foot/ankle: Visible nodule within achilles about 2-3 cm proximal to insertion.  No other deformity, swelling, bruising. FROM ankle with 5/5 strength all directions. TTP at nodule noted above.  No other foot/ankle tenderness. Negative ant drawer and talar tilt.   Negative syndesmotic compression. Negative calcaneal squeeze. Thompsons test negative. NV intact distally.  Right foot/ankle: FROM without pain.  MSK u/s:  Depth of left achilles 1.1cm with 3 visible hypoechoic areas within tendon separately - largest about 15% of achilles volume.  Increased neovascularity as well.    Assessment & Plan:  1. Left achilles tendinitis - significant swelling, some partial tearing visualized on ultrasound.  He will start with home exercises, heel lifts, icing, nsaids.  Avoid flat shoes, barefoot walking, inclines.  Consider nitro patches, PT, orthotics if not improving as expected.  F/u in 6 weeks.

## 2016-05-21 ENCOUNTER — Other Ambulatory Visit (INDEPENDENT_AMBULATORY_CARE_PROVIDER_SITE_OTHER): Payer: Self-pay | Admitting: Orthopedic Surgery

## 2016-05-23 NOTE — Telephone Encounter (Signed)
Rx refill

## 2016-06-13 ENCOUNTER — Ambulatory Visit (INDEPENDENT_AMBULATORY_CARE_PROVIDER_SITE_OTHER): Payer: Self-pay | Admitting: Orthopaedic Surgery

## 2016-07-12 ENCOUNTER — Ambulatory Visit (INDEPENDENT_AMBULATORY_CARE_PROVIDER_SITE_OTHER): Payer: Self-pay | Admitting: Orthopaedic Surgery

## 2016-07-27 ENCOUNTER — Ambulatory Visit (INDEPENDENT_AMBULATORY_CARE_PROVIDER_SITE_OTHER): Payer: BC Managed Care – PPO | Admitting: Orthopaedic Surgery

## 2016-07-27 ENCOUNTER — Ambulatory Visit (INDEPENDENT_AMBULATORY_CARE_PROVIDER_SITE_OTHER): Payer: Self-pay

## 2016-07-27 DIAGNOSIS — Z96641 Presence of right artificial hip joint: Secondary | ICD-10-CM

## 2016-07-27 NOTE — Progress Notes (Signed)
The patient is now over a year status post a right total hip arthroplasty direct injury approach. He ice skates regularly and has no issues with that hip at all. He denies any pain at all. He says his leg lengths are equal.  On exam his leg lengths are equal. He tolerates easily putting his right hip to full internal rotation rotation as well as flexion extension with no pain at all. An AP pelvis and lateral of his right hip shows a well-seated implant with no, getting features at all.  At this point a follow-up as needed in terms of his hip. If he has any issues we talked in detail what would need to bring about for the hip. We can always see him for anything else as well.

## 2016-08-26 ENCOUNTER — Other Ambulatory Visit (INDEPENDENT_AMBULATORY_CARE_PROVIDER_SITE_OTHER): Payer: Self-pay | Admitting: Orthopedic Surgery

## 2016-10-24 ENCOUNTER — Encounter: Payer: Self-pay | Admitting: Family Medicine

## 2016-10-24 ENCOUNTER — Ambulatory Visit (INDEPENDENT_AMBULATORY_CARE_PROVIDER_SITE_OTHER): Payer: BC Managed Care – PPO | Admitting: Family Medicine

## 2016-10-24 DIAGNOSIS — M25511 Pain in right shoulder: Secondary | ICD-10-CM

## 2016-10-24 NOTE — Patient Instructions (Signed)
Your exam is reassuring. You have impingement and subacromial bursitis - your rotator cuff strength is good though and it doesn't hurt to test each muscle individually. Focus on the motion exercises (arm circles, swings, internal/external rotations, table slides) 3 sets of 10 twice a day. Would not return to hockey until motion is full, minimal pain (1-2/10), and 80% strength compared to left side. Start theraband exercises once you reach the above parameters. Icing 15 minutes at a time 3-4 times a day. Aleve 2 tabs twice a day with food for 7-10 days then as needed. See me back in 2 weeks if you're not improving as expected.

## 2016-10-24 NOTE — Assessment & Plan Note (Signed)
[place] injury but no tenderness throughout shoulder and proximal humerus.  Rotator cuff strength is intact and surprisingly no pain on this testing.  Positive impingement testing.  Suggests bursitis and impingement as causes of pain.  Shown home motion exercises to focus on as pain improves.  Icing, aleve.  As pain improves add theraband strengthening.  F/u in 2 weeks for reevaluation.

## 2016-10-24 NOTE — Progress Notes (Signed)
PCP: Jerlyn Ly, MD  Subjective:   HPI: Patient is a 56 y.o. male here for right shoulder injury.  Patient reports he has had some intermittent soreness in right shoulder past few months. Recalls pain following a boot camp at school, some soreness laterally at other times. He also had surgery on this shoulder though op report is not available (including a tenodesis though). Then was playing ice hockey last night and fell forward sustaining [place] injury then went into boards. Pain didn't start until after the game really - was able to finish the game and take slap shots. Pain now 0/10 at rest, up to 7/10 with overhead motions. Took aleve yesterday and today. + night pain. Radiates to upper arm. Right handed. No skin changes, numbness.  Past Medical History:  Diagnosis Date  . Arthritis   . Skin cancer    removed from chest and behind left ear    Current Outpatient Prescriptions on File Prior to Visit  Medication Sig Dispense Refill  . nitroGLYCERIN (NITRODUR - DOSED IN MG/24 HR) 0.2 mg/hr patch APPLY 1/4 PATCH DAILY TO AFFECTED AREA 30 patch 0  . rosuvastatin (CRESTOR) 5 MG tablet      No current facility-administered medications on file prior to visit.     Past Surgical History:  Procedure Laterality Date  . COLONOSCOPY    . KNEE ARTHROSCOPY Bilateral   . KNEE SURGERY Left    meniscal debridement  . SHOULDER SURGERY Right    tenodesis - ?biceps  . TOTAL HIP ARTHROPLASTY Right 06/09/2015   Procedure: RIGHT TOTAL HIP ARTHROPLASTY ANTERIOR APPROACH;  Surgeon: Mcarthur Rossetti, MD;  Location: [facility];  Service: Orthopedics;  Laterality: Right;    Allergies  Allergen Reactions  . Aspirin Other (See Comments)    Stomach or kidney pain  . Neosporin Original [Bacitracin-Neomycin-Polymyxin] Rash  . Other Swelling    Tree nuts cause mild throat swelling  . Polysporin [Bacitracin-Polymyxin B] Rash    Social History   Social History  . Marital status: Married   Spouse name: N/A  . Number of children: N/A  . Years of education: N/A   Occupational History  . Not on file.   Social History Main Topics  . Smoking status: Never Smoker  . Smokeless tobacco: Never Used  . Alcohol use 0.0 oz/week     Comment: social  . Drug use: Unknown  . Sexual activity: Not on file   Other Topics Concern  . Not on file   Social History Narrative  . No narrative on file    Family History  Problem Relation Age of Onset  . Hypertension Father   . Hypertension Brother   . Diabetes Neg Hx   . Heart attack Neg Hx   . Hyperlipidemia Neg Hx   . Sudden death Neg Hx     BP (!) 148/90   Pulse 96   Ht 5\' 6"  (1.676 m)   Wt 168 lb (76.2 kg)   BMI 27.12 kg/m   Review of Systems: See HPI above.     Objective:  Physical Exam:  Gen: NAD, comfortable in exam room  Right shoulder: No swelling, ecchymoses.  No gross deformity. No TTP. FROM with painful arc. Positive Hawkins, Neers. Negative Yergasons. Strength 5/5 with empty can and resisted internal/external rotation.  No pain. Negative apprehension. NV intact distally.  Left shoulder: FROM without pain.   Assessment & Plan:  1. Right shoulder injury - [place] injury but no tenderness throughout shoulder and  proximal humerus.  Rotator cuff strength is intact and surprisingly no pain on this testing.  Positive impingement testing.  Suggests bursitis and impingement as causes of pain.  Shown home motion exercises to focus on as pain improves.  Icing, aleve.  As pain improves add theraband strengthening.  F/u in 2 weeks for reevaluation.

## 2016-12-04 ENCOUNTER — Other Ambulatory Visit (INDEPENDENT_AMBULATORY_CARE_PROVIDER_SITE_OTHER): Payer: Self-pay | Admitting: Orthopedic Surgery

## 2016-12-05 NOTE — Telephone Encounter (Signed)
Rx request, CB patient

## 2016-12-05 NOTE — Telephone Encounter (Signed)
Please advise 

## 2017-06-01 ENCOUNTER — Encounter (INDEPENDENT_AMBULATORY_CARE_PROVIDER_SITE_OTHER): Payer: Self-pay | Admitting: Orthopedic Surgery

## 2017-06-01 ENCOUNTER — Ambulatory Visit (INDEPENDENT_AMBULATORY_CARE_PROVIDER_SITE_OTHER): Payer: BC Managed Care – PPO

## 2017-06-01 ENCOUNTER — Ambulatory Visit (INDEPENDENT_AMBULATORY_CARE_PROVIDER_SITE_OTHER): Payer: BC Managed Care – PPO | Admitting: Orthopedic Surgery

## 2017-06-01 DIAGNOSIS — G8929 Other chronic pain: Secondary | ICD-10-CM | POA: Diagnosis not present

## 2017-06-01 DIAGNOSIS — M25511 Pain in right shoulder: Secondary | ICD-10-CM | POA: Diagnosis not present

## 2017-06-04 NOTE — Progress Notes (Signed)
Office Visit Note   Patient: Jeff Murphy           Date of Birth: Jul 22, 1960           MRN: [id number] Visit Date: 06/01/2017 Requested by: Crist Infante, MD [address] [place], [place] [address] PCP: Crist Infante, MD  Subjective: Chief Complaint  Patient presents with  . Right Shoulder - Pain    HPI: Patient with right shoulder pain of one-year duration.  Scribes anterior and lateral shoulder pain with range of motion.  He has done therapy and exercises.  This pain limits his activities "a lot".  Does not take medication for the problem.  He was recommended by his primary care physician not to take anti-inflammatories.  He has had prior surgery in the shoulder which was biceps tenodesis.  Osteoarthritic changes noted in the shoulder at that time which were mild.  He is very active with his children.              ROS: All systems reviewed are negative as they relate to the chief complaint within the history of present illness.  Patient denies  fevers or chills.   Assessment & Plan: Visit Diagnoses:  1. Chronic right shoulder pain     Plan: Impression is right shoulder pain which may have a component of bursitis and may also have a component of osteoarthritis present.  Rotator cuff pathology less likely based on strength examination.  Plan is divided injection today into the subacromial space and glenohumeral joint.  I will see him back as needed.  Avoiding compressive loads across the glenohumeral joint will be important to the long-term function and health of his shoulder  Follow-Up Instructions: Return if symptoms worsen or fail to improve.   Orders:  Orders Placed This Encounter  Procedures  . XR Shoulder Right   No orders of the defined types were placed in this encounter.     Procedures: No procedures performed   Clinical Data: No additional findings.  Objective: Vital Signs: There were no vitals taken for this visit.  Physical Exam:   Constitutional:  Patient appears well-developed HEENT:  Head: Normocephalic Eyes:EOM are normal Neck: Normal range of motion Cardiovascular: Normal rate Pulmonary/chest: Effort normal Neurologic: Patient is alert Skin: Skin is warm Psychiatric: Patient has normal mood and affect    Ortho Exam: Pubic exam demonstrates full active and passive range of motion of the right shoulder with some impingement signs positive on the right and negative on the left.  I do not detect any coarse grinding in the right shoulder with passive range of motion.  Rotator cuff strength is excellent to infraspinatus supraspinatus and subscapularis muscle testing.  No acromioclavicular joint tenderness to direct palpation  Specialty Comments:  No specialty comments available.  Imaging: No results found.   PMFS History: Patient Active Problem List   Diagnosis Date Noted  . Right shoulder pain 10/24/2016  . Achilles tendinitis of left lower extremity 02/02/2016  . Osteoarthritis of right hip 06/09/2015  . Status post total replacement of right hip 06/09/2015  . Right hip pain 01/22/2014  . Right knee pain 10/26/2012   Past Medical History:  Diagnosis Date  . Arthritis   . Skin cancer    removed from chest and behind left ear    Family History  Problem Relation Age of Onset  . Hypertension Father   . Hypertension Brother   . Diabetes Neg Hx   . Heart attack Neg Hx   .  Hyperlipidemia Neg Hx   . Sudden death Neg Hx     Past Surgical History:  Procedure Laterality Date  . COLONOSCOPY    . KNEE ARTHROSCOPY Bilateral   . KNEE SURGERY Left    meniscal debridement  . SHOULDER SURGERY Right    tenodesis - ?biceps  . TOTAL HIP ARTHROPLASTY Right 06/09/2015   Procedure: RIGHT TOTAL HIP ARTHROPLASTY ANTERIOR APPROACH;  Surgeon: Mcarthur Rossetti, MD;  Location: [facility];  Service: Orthopedics;  Laterality: Right;   Social History   Occupational History  . Not on file  Tobacco Use  . Smoking status: Never  Smoker  . Smokeless tobacco: Never Used  Substance and Sexual Activity  . Alcohol use: Yes    Alcohol/week: 0.0 oz    Comment: social  . Drug use: Not on file  . Sexual activity: Not on file

## 2017-06-06 ENCOUNTER — Telehealth (INDEPENDENT_AMBULATORY_CARE_PROVIDER_SITE_OTHER): Payer: Self-pay | Admitting: Orthopedic Surgery

## 2017-06-06 NOTE — Telephone Encounter (Signed)
Patient has a question about his procedure he just had and would like someone to call him back.  CB#[phone].  Thank you.

## 2017-06-06 NOTE — Telephone Encounter (Signed)
Last shot was divided into both sub acromial space and joint - he can come back in and get newton to do flouro guided  shoulder joint injection next week that would be best

## 2017-06-06 NOTE — Telephone Encounter (Signed)
Patient states when he was here for his last appt, he received shoulder joint injection. He has not noticed a lot of relief from the injection (it has been 5 days and he realizes it may take longer to take effect).  He states that Dr. Marlou Sa also mentioned a second shot for bursitis in a different place and would like to know if that is an option for him to come in and have done.  Please advise. I did explain it would be tomorrow before he would get return call.

## 2017-06-08 NOTE — Telephone Encounter (Signed)
I tried calling patient to discuss. No answer. LMVM for him to return call to discuss.

## 2017-06-08 NOTE — Telephone Encounter (Signed)
I have not returned call to patient to advise. I don't think Dr. Ernestina Patches has anything available next week.

## 2018-03-09 ENCOUNTER — Encounter (INDEPENDENT_AMBULATORY_CARE_PROVIDER_SITE_OTHER): Payer: Self-pay | Admitting: Orthopedic Surgery

## 2018-03-09 ENCOUNTER — Ambulatory Visit (INDEPENDENT_AMBULATORY_CARE_PROVIDER_SITE_OTHER): Payer: BC Managed Care – PPO | Admitting: Orthopedic Surgery

## 2018-03-09 DIAGNOSIS — G8929 Other chronic pain: Secondary | ICD-10-CM | POA: Diagnosis not present

## 2018-03-09 DIAGNOSIS — M19011 Primary osteoarthritis, right shoulder: Secondary | ICD-10-CM | POA: Diagnosis not present

## 2018-03-09 DIAGNOSIS — M25511 Pain in right shoulder: Secondary | ICD-10-CM | POA: Diagnosis not present

## 2018-03-09 NOTE — Progress Notes (Signed)
Office Visit Note   Patient: Jeff Murphy           Date of Birth: 12-20-1960           MRN: [id number] Visit Date: 03/09/2018 Requested by: Crist Infante, MD [address] [place], [place] [address] PCP: Crist Infante, MD  Subjective: Chief Complaint  Patient presents with  . Right Shoulder - Pain    HPI: Oather is a patient with right shoulder pain.  He had arthroscopy and biceps tenodesis done about 4 or 5 years ago.  Had some focal arthritis in the shoulder region at that time.  Reports continued worsening pain this year.  Hurts him every day.  Sometimes the level is 7 out of 10.  Most of his symptoms occur with abduction.  He is able to sleep on that side.  Sometimes the weight of his arm hurts his shoulder.  He is playing hockey and doing some weightlifting.  Most of his pain is anterior.  He has had a cortisone injection which has not helped.              ROS: All systems reviewed are negative as they relate to the chief complaint within the history of present illness.  Patient denies  fevers or chills.   Assessment & Plan: Visit Diagnoses:  1. Chronic right shoulder pain   2. Arthritis of right shoulder region     Plan: Impression is likely progression of focal humeral head arthritis.  His rotator cuff strength is actually pretty good.  All labral pathology has been addressed.  He has had a biceps tenodesis.  Plan at this time is right shoulder MRI arthrogram to evaluate for possible small cuff tear versus progression of glenohumeral arthritis primarily on the humeral side.  Essentially he is very functional at this time.  The question really is whether or not he has enough glenohumeral joint arthritis to warrant full shoulder replacement in the future or whether he would benefit from humeral head replacement at this time.  We will see him back after that MRI arthrogram.  In general I think if we can get him into his 60s before he needs any type of procedure that would be ideal.   I would likely favor a fluoroscopically guided injection into the shoulder joint in the future if needed.  Follow-Up Instructions: Return for after MRI.   Orders:  Orders Placed This Encounter  Procedures  . MR SHOULDER RIGHT W CONTRAST  . Arthrogram   No orders of the defined types were placed in this encounter.     Procedures: No procedures performed   Clinical Data: No additional findings.  Objective: Vital Signs: There were no vitals taken for this visit.  Physical Exam:   Constitutional: Patient appears well-developed HEENT:  Head: Normocephalic Eyes:EOM are normal Neck: Normal range of motion Cardiovascular: Normal rate Pulmonary/chest: Effort normal Neurologic: Patient is alert Skin: Skin is warm Psychiatric: Patient has normal mood and affect    Ortho Exam: Ortho exam demonstrates about 10 degrees less forward flexion on the right compared to the left.  Shoulder strength is good to isolated infraspinatus supraspinatus and subscap muscle testing.  No masses lymph adenopathy or skin changes noted in that shoulder girdle region.  Well-healed surgical incision is present from prior biceps tenodesis.  He does have some pain and crepitus with abduction but not forward flexion or external rotation.  His external rotation on the left is about 45 degrees on the right he is  around 30.  Specialty Comments:  No specialty comments available.  Imaging: No results found.   PMFS History: Patient Active Problem List   Diagnosis Date Noted  . Right shoulder pain 10/24/2016  . Achilles tendinitis of left lower extremity 02/02/2016  . Osteoarthritis of right hip 06/09/2015  . Status post total replacement of right hip 06/09/2015  . Right hip pain 01/22/2014  . Right knee pain 10/26/2012   Past Medical History:  Diagnosis Date  . Arthritis   . Skin cancer    removed from chest and behind left ear    Family History  Problem Relation Age of Onset  . Hypertension  Father   . Hypertension Brother   . Diabetes Neg Hx   . Heart attack Neg Hx   . Hyperlipidemia Neg Hx   . Sudden death Neg Hx     Past Surgical History:  Procedure Laterality Date  . COLONOSCOPY    . KNEE ARTHROSCOPY Bilateral   . KNEE SURGERY Left    meniscal debridement  . SHOULDER SURGERY Right    tenodesis - ?biceps  . TOTAL HIP ARTHROPLASTY Right 06/09/2015   Procedure: RIGHT TOTAL HIP ARTHROPLASTY ANTERIOR APPROACH;  Surgeon: Mcarthur Rossetti, MD;  Location: [facility];  Service: Orthopedics;  Laterality: Right;   Social History   Occupational History  . Not on file  Tobacco Use  . Smoking status: Never Smoker  . Smokeless tobacco: Never Used  Substance and Sexual Activity  . Alcohol use: Yes    Alcohol/week: 0.0 standard drinks    Comment: social  . Drug use: Not on file  . Sexual activity: Not on file

## 2018-04-06 ENCOUNTER — Ambulatory Visit
Admission: RE | Admit: 2018-04-06 | Discharge: 2018-04-06 | Disposition: A | Discharge status: Home / Self Care | Payer: BC Managed Care – PPO | Source: Ambulatory Visit | Attending: Orthopedic Surgery | Admitting: Orthopedic Surgery

## 2018-04-06 DIAGNOSIS — G8929 Other chronic pain: Secondary | ICD-10-CM

## 2018-04-06 DIAGNOSIS — M25511 Pain in right shoulder: Principal | ICD-10-CM

## 2018-04-06 MED ORDER — IOPAMIDOL (ISOVUE-M 200) INJECTION 41%
15.0000 mL | Freq: Once | INTRAMUSCULAR | Status: AC
  Administered 2018-04-06: 15 mL via INTRA_ARTICULAR

## 2018-04-27 ENCOUNTER — Encounter (INDEPENDENT_AMBULATORY_CARE_PROVIDER_SITE_OTHER): Payer: Self-pay | Admitting: Orthopedic Surgery

## 2018-04-27 ENCOUNTER — Telehealth (INDEPENDENT_AMBULATORY_CARE_PROVIDER_SITE_OTHER): Payer: Self-pay | Admitting: Orthopedic Surgery

## 2018-04-27 ENCOUNTER — Ambulatory Visit (INDEPENDENT_AMBULATORY_CARE_PROVIDER_SITE_OTHER): Payer: BC Managed Care – PPO | Admitting: Orthopedic Surgery

## 2018-04-27 DIAGNOSIS — G8929 Other chronic pain: Secondary | ICD-10-CM

## 2018-04-27 DIAGNOSIS — M25511 Pain in right shoulder: Secondary | ICD-10-CM | POA: Diagnosis not present

## 2018-04-27 MED ORDER — IBUPROFEN-FAMOTIDINE 800-26.6 MG PO TABS: ORAL_TABLET | ORAL | 0 refills | Status: AC

## 2018-04-27 NOTE — Telephone Encounter (Signed)
IC advised ok to fill #[phone] instead

## 2018-04-27 NOTE — Telephone Encounter (Signed)
Jeff Murphy with [facility] received prescription from [facility] and she is needing to clarify instructions for Duexis with a quantity of 90, she said it was only written for once a day but she said normally it is written 2 or 3 times daily. Please advise Jeff Murphy # [phone]

## 2018-04-30 ENCOUNTER — Encounter (INDEPENDENT_AMBULATORY_CARE_PROVIDER_SITE_OTHER): Payer: Self-pay | Admitting: Orthopedic Surgery

## 2018-04-30 NOTE — Progress Notes (Signed)
Office Visit Note   Patient: Jeff Murphy           Date of Birth: 1961-01-24           MRN: [id number] Visit Date: 04/27/2018 Requested by: Crist Infante, MD [address] [place], [place] [address] PCP: Crist Infante, MD  Subjective: Chief Complaint  Patient presents with  . Right Shoulder - Follow-up    HPI: Jeff Murphy is a patient with right shoulder pain.  Since of seen him he had an MRI scan which is reviewed with the patient today.  It does show glenohumeral arthritis along with pinhole full-thickness rotator cuff tear.  He is having a nagging type of pain in the right shoulder.  I reviewed the MRI scan with similar scan from about 4 years ago and the tendinosis has progressed in the supraspinatus tendon.  He is had prior biceps tenodesis.  The arthritis does not look like its progressed as much as the soft tissue rotator cuff pathology              ROS: All systems reviewed are negative as they relate to the chief complaint within the history of present illness.  Patient denies  fevers or chills.   Assessment & Plan: Visit Diagnoses:  1. Chronic right shoulder pain     Plan: Impression is right shoulder pain pinhole full-thickness cuff tear with shoulder arthritis.  I will try him with Duexis prescription and a sample box one per day as needed for symptoms.  I also like for him to get an intra-articular shoulder joint injection done with Dr. Ernestina Patches.  We will see if that calms his symptoms down.  I think the rotator cuff tear is likely symptomatic but does not really require intervention at this time but if it is not a great time for him to do that.  I think would be reasonable for him to come back in 3 or 4 months if his shoulder symptoms are not improved with the injection.  I did caution him to be careful with lifting any type of weight away from his body with his arm extended.  Follow-Up Instructions: Return if symptoms worsen or fail to improve.   Orders:  Orders Placed This  Encounter  Procedures  . Ambulatory referral to Physical Medicine Rehab   Meds ordered this encounter  Medications  . Ibuprofen-Famotidine (DUEXIS) 800-26.6 MG TABS    Sig: 1 po daily prn pain    Dispense:  90 tablet    Refill:  0      Procedures: No procedures performed   Clinical Data: No additional findings.  Objective: Vital Signs: There were no vitals taken for this visit.  Physical Exam:   Constitutional: Patient appears well-developed HEENT:  Head: Normocephalic Eyes:EOM are normal Neck: Normal range of motion Cardiovascular: Normal rate Pulmonary/chest: Effort normal Neurologic: Patient is alert Skin: Skin is warm Psychiatric: Patient has normal mood and affect    Ortho Exam: Ortho exam demonstrates fairly reasonable shoulder range of motion with slight limitation of forward flexion and abduction on the right compared to the left.  Rotator cuff strength is pretty good but he does have a hitch with abduction around 80 degrees.  Not much in the way of coarse grinding or crepitus with internal/external rotation on the right-hand side and I only feel a little bit of bone-on-bone arthritis at the very end of internal rotation with the arm at neutral with abduction.  Specialty Comments:  No specialty comments available.  Imaging: No results found.   PMFS History: Patient Active Problem List   Diagnosis Date Noted  . Right shoulder pain 10/24/2016  . Achilles tendinitis of left lower extremity 02/02/2016  . Osteoarthritis of right hip 06/09/2015  . Status post total replacement of right hip 06/09/2015  . Right hip pain 01/22/2014  . Right knee pain 10/26/2012   Past Medical History:  Diagnosis Date  . Arthritis   . Skin cancer    removed from chest and behind left ear    Family History  Problem Relation Age of Onset  . Hypertension Father   . Hypertension Brother   . Diabetes Neg Hx   . Heart attack Neg Hx   . Hyperlipidemia Neg Hx   . Sudden  death Neg Hx     Past Surgical History:  Procedure Laterality Date  . COLONOSCOPY    . KNEE ARTHROSCOPY Bilateral   . KNEE SURGERY Left    meniscal debridement  . SHOULDER SURGERY Right    tenodesis - ?biceps  . TOTAL HIP ARTHROPLASTY Right 06/09/2015   Procedure: RIGHT TOTAL HIP ARTHROPLASTY ANTERIOR APPROACH;  Surgeon: Mcarthur Rossetti, MD;  Location: [facility];  Service: Orthopedics;  Laterality: Right;   Social History   Occupational History  . Not on file  Tobacco Use  . Smoking status: Never Smoker  . Smokeless tobacco: Never Used  Substance and Sexual Activity  . Alcohol use: Yes    Alcohol/week: 0.0 standard drinks    Comment: social  . Drug use: Not on file  . Sexual activity: Not on file

## 2018-05-16 ENCOUNTER — Ambulatory Visit (INDEPENDENT_AMBULATORY_CARE_PROVIDER_SITE_OTHER): Payer: Self-pay

## 2018-05-16 ENCOUNTER — Encounter (INDEPENDENT_AMBULATORY_CARE_PROVIDER_SITE_OTHER): Payer: Self-pay | Admitting: Physical Medicine and Rehabilitation

## 2018-05-16 ENCOUNTER — Ambulatory Visit (INDEPENDENT_AMBULATORY_CARE_PROVIDER_SITE_OTHER): Payer: BC Managed Care – PPO | Admitting: Physical Medicine and Rehabilitation

## 2018-05-16 DIAGNOSIS — M25511 Pain in right shoulder: Secondary | ICD-10-CM | POA: Diagnosis not present

## 2018-05-16 DIAGNOSIS — G8929 Other chronic pain: Secondary | ICD-10-CM | POA: Diagnosis not present

## 2018-05-16 NOTE — Progress Notes (Signed)
Jeff Murphy - 57 y.o. male MRN [id number]  Date of birth: [birth year]  Office Visit Note: Visit Date: 05/16/2018 PCP: Crist Infante, MD Referred by: Crist Infante, MD  Subjective: Chief Complaint  Patient presents with  . Right Shoulder - Pain   HPI: Jeff Murphy is a 57 y.o. male who comes in today For diagnostic and hopefully therapeutic right glenohumeral joint injection with fluoroscopic guidance.  Patient has some glenohumeral joint arthritis and pinhole full-thickness rotator cuff tear.  Has been having shoulder pain for some time worse with movement.  ROS Otherwise per HPI.  Assessment & Plan: Visit Diagnoses:  1. Chronic right shoulder pain     Plan: Findings:  Patient seemed to have good relief during the anesthetic phase at least with movement.  There was extravasation of contrast showing rotator cuff tear.    Meds & Orders: No orders of the defined types were placed in this encounter.   Orders Placed This Encounter  Procedures  . Large Joint Inj: R glenohumeral  . XR C-ARM NO REPORT    Follow-up: Return for  Anderson Malta, M.D..   Procedures: Large Joint Inj: R glenohumeral on 05/16/2018 11:20 AM Indications: pain and diagnostic evaluation Details: 22 G 3.5 in needle, fluoroscopy-guided anteromedial approach  Arthrogram: No  Medications: 80 mg triamcinolone acetonide 40 MG/ML; 3 mL bupivacaine 0.5 % Outcome: tolerated well, no immediate complications  There was excellent flow of contrast producing a partial arthrogram of the glenohumeral joint. The patient did have relief of symptoms during the anesthetic phase of the injection. Procedure, treatment alternatives, risks and benefits explained, specific risks discussed. Consent was given by the patient. Immediately prior to procedure a time out was called to verify the correct patient, procedure, equipment, support staff and site/side marked as required. Patient was prepped and draped in the usual sterile  fashion.      No notes on file   Clinical History: No specialty comments available.   He reports that he has never smoked. He has never used smokeless tobacco. No results for input(s): HGBA1C, LABURIC in the last 8760 hours.  Objective:  VS:  HT:    WT:   BMI:     BP:   HR: bpm  TEMP: ( )  RESP:  Physical Exam  Ortho Exam Imaging: No results found.  Past Medical/Family/Surgical/Social History: Medications & Allergies reviewed per EMR, new medications updated. Patient Active Problem List   Diagnosis Date Noted  . Right shoulder pain 10/24/2016  . Achilles tendinitis of left lower extremity 02/02/2016  . Osteoarthritis of right hip 06/09/2015  . Status post total replacement of right hip 06/09/2015  . Right hip pain 01/22/2014  . Right knee pain 10/26/2012   Past Medical History:  Diagnosis Date  . Arthritis   . Skin cancer    removed from chest and behind left ear   Family History  Problem Relation Age of Onset  . Hypertension Father   . Hypertension Brother   . Diabetes Neg Hx   . Heart attack Neg Hx   . Hyperlipidemia Neg Hx   . Sudden death Neg Hx    Past Surgical History:  Procedure Laterality Date  . COLONOSCOPY    . KNEE ARTHROSCOPY Bilateral   . KNEE SURGERY Left    meniscal debridement  . SHOULDER SURGERY Right    tenodesis - ?biceps  . TOTAL HIP ARTHROPLASTY Right 06/09/2015   Procedure: RIGHT TOTAL HIP ARTHROPLASTY ANTERIOR APPROACH;  Surgeon: Mcarthur Rossetti,  MD;  Location: [facility];  Service: Orthopedics;  Laterality: Right;   Social History   Occupational History  . Not on file  Tobacco Use  . Smoking status: Never Smoker  . Smokeless tobacco: Never Used  Substance and Sexual Activity  . Alcohol use: Yes    Alcohol/week: 0.0 standard drinks    Comment: social  . Drug use: Not on file  . Sexual activity: Not on file

## 2018-05-16 NOTE — Progress Notes (Signed)
 .  Numeric Pain Rating Scale and Functional Assessment Average Pain 6   In the last MONTH (on 0-10 scale) has pain interfered with the following?  1. General activity like being  able to carry out your everyday physical activities such as walking, climbing stairs, carrying groceries, or moving a chair?  Rating(4)    -Dye Allergies.  

## 2018-05-23 MED ORDER — BUPIVACAINE HCL 0.5 % IJ SOLN
3.0000 mL | INTRAMUSCULAR | Status: AC | PRN
  Administered 2018-05-16: 3 mL via INTRA_ARTICULAR

## 2018-05-23 MED ORDER — TRIAMCINOLONE ACETONIDE 40 MG/ML IJ SUSP
80.0000 mg | INTRAMUSCULAR | Status: AC | PRN
  Administered 2018-05-16: 80 mg via INTRA_ARTICULAR

## 2019-01-23 IMAGING — MR MR SHOULDER*R* W/CM
6 series · 40 of 40 positions shown · IV contrast (agent unspecified)
Comparison: MR arthrogram right shoulder 09/28/2011.

CLINICAL DATA: Right shoulder pain for 18 months. No known injury.
History of prior surgery.

EXAM:
MR ARTHROGRAM OF THE RIGHT SHOULDER
TECHNIQUE: Multiplanar, multisequence MR imaging of the right shoulder was
performed following the administration of intra-articular contrast.
CONTRAST:  See Injection Documentation.

[Series 3: T1 fat-sat · axial · 4.0mm · 0.27mm/px · z∈[-37,+60]mm · 8 of 21 slices shown (1 of 4)]
[im 1/21]
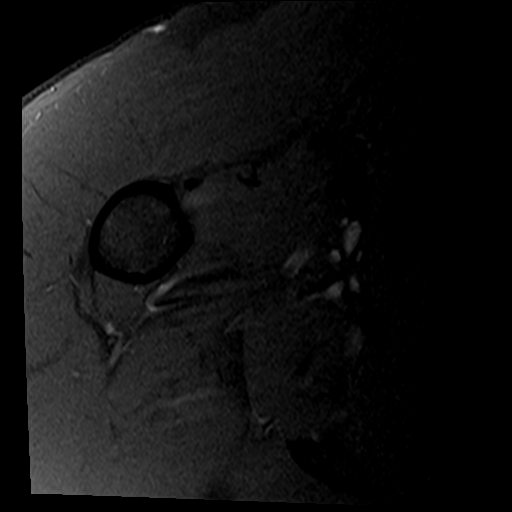
[im 3/21]
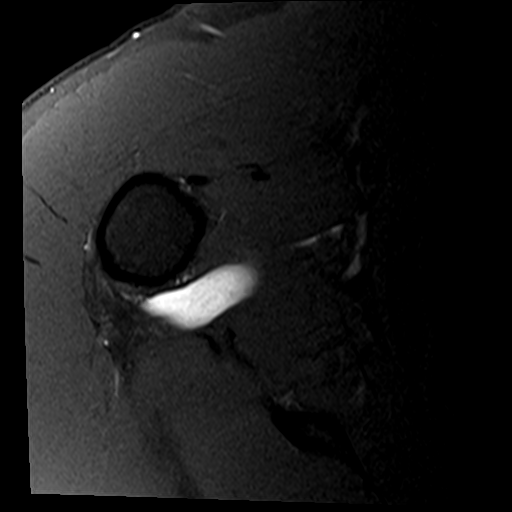
[im 6/21]
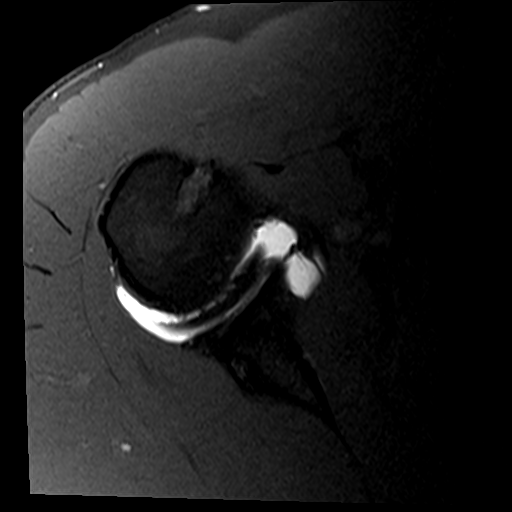
[im 9/21]
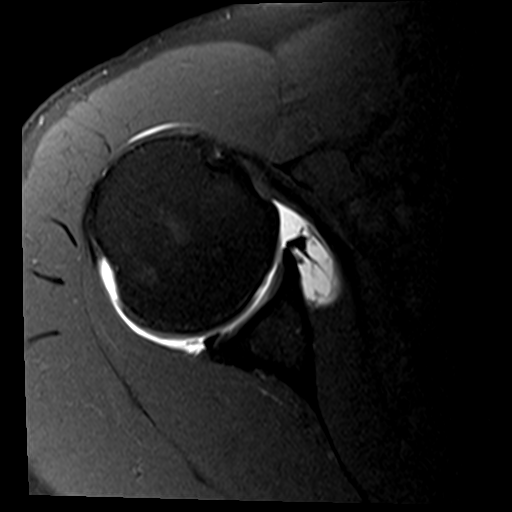
[im 12/21]
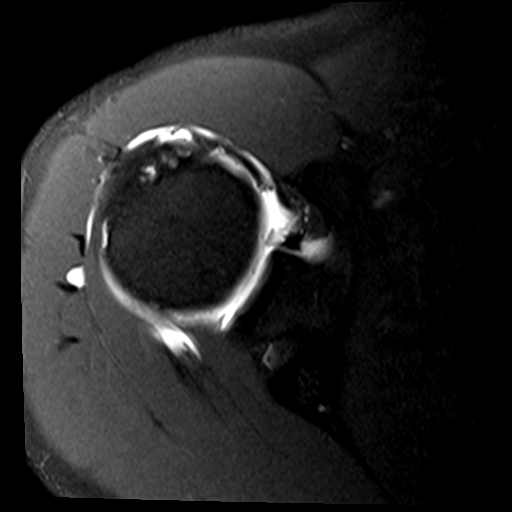
[im 15/21]
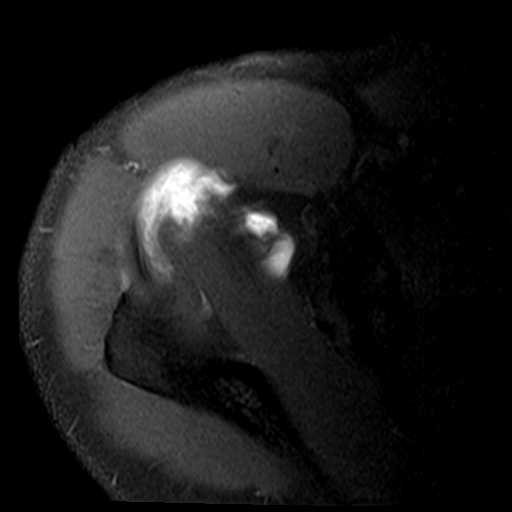
[im 18/21]
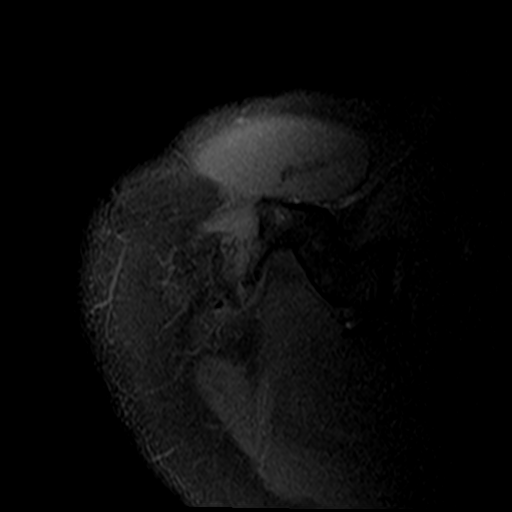
[im 21/21]
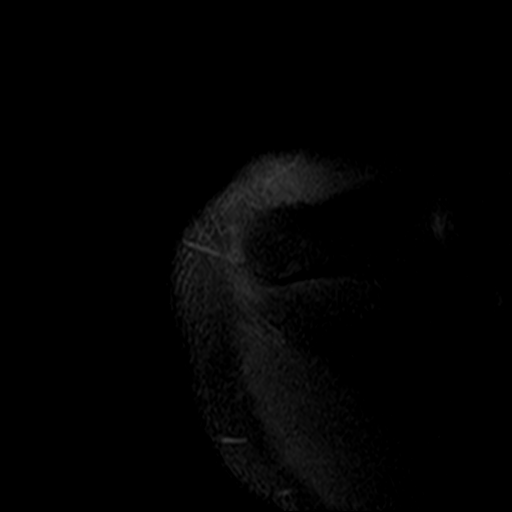

[Series 4: T2 fat-sat · coronal · 4.0mm · 0.55mm/px · 8 of 22 slices shown (1 of 2)]
[im 1/22]
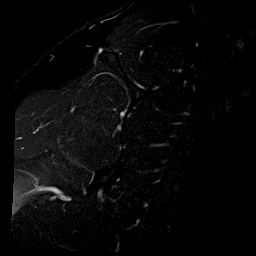
[im 4/22]
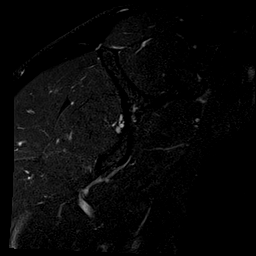
[im 7/22]
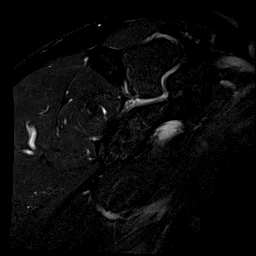
[im 10/22]
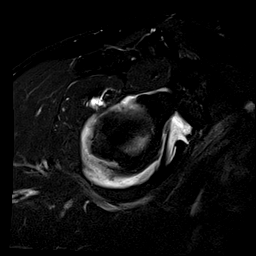
[im 13/22]
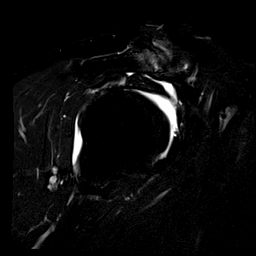
[im 16/22]
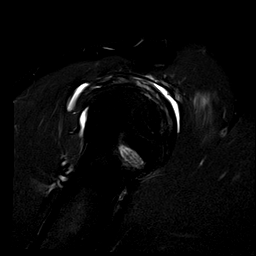
[im 19/22]
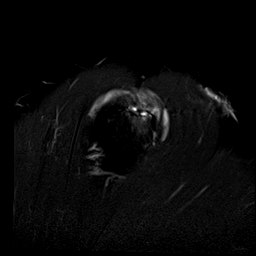
[im 22/22]
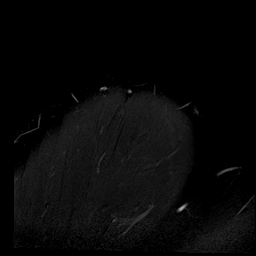

[Series 5: T1 fat-sat · oblique · 4.0mm · 0.55mm/px · 6 of 16 slices shown (2 of 4)]
[im 1/16]
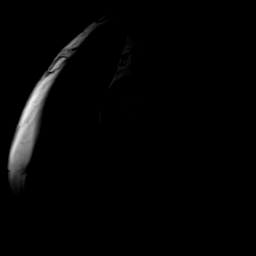
[im 4/16]
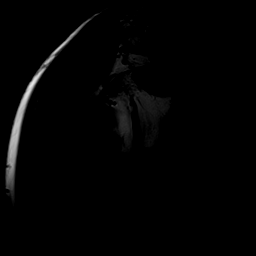
[im 7/16]
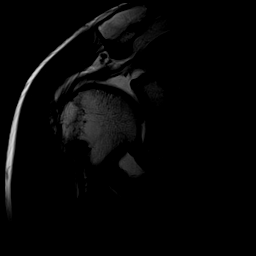
[im 10/16]
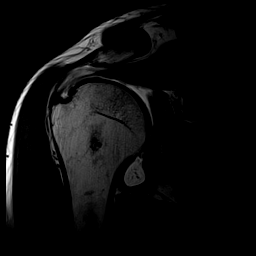
[im 13/16]
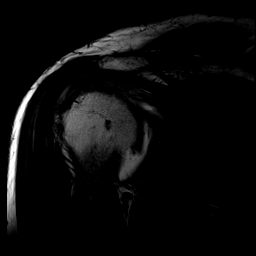
[im 16/16]
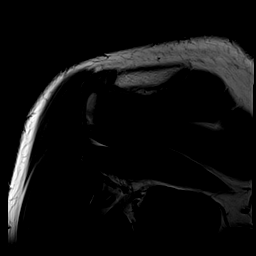

[Series 6: T1 fat-sat · oblique · 4.0mm · 0.55mm/px · 6 of 16 slices shown (3 of 4)]
[im 1/16]
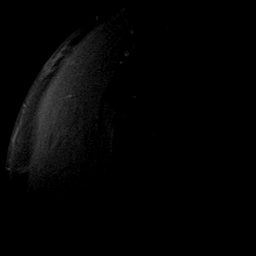
[im 4/16]
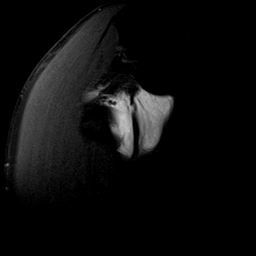
[im 7/16]
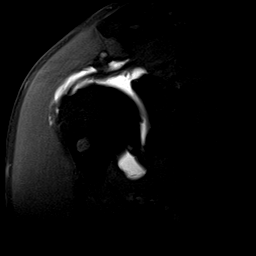
[im 10/16]
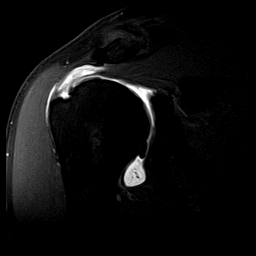
[im 13/16]
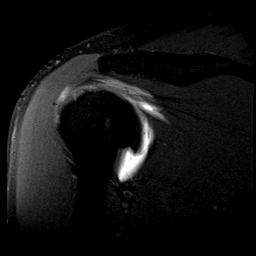
[im 16/16]
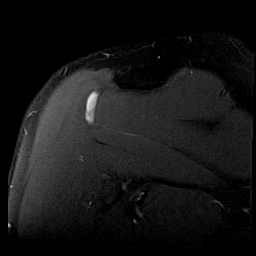

[Series 7: T2 fat-sat · oblique · 4.0mm · 0.55mm/px · 6 of 16 slices shown (2 of 2)]
[im 1/16]
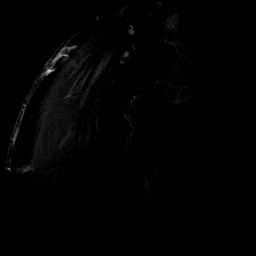
[im 4/16]
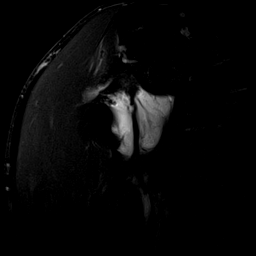
[im 7/16]
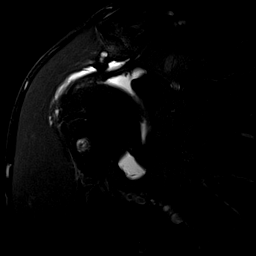
[im 10/16]
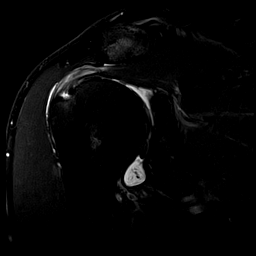
[im 13/16]
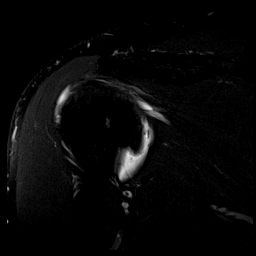
[im 16/16]
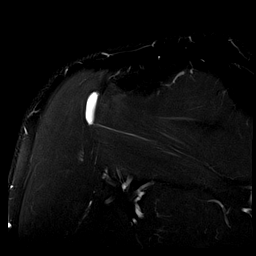

[Series 10: T1 fat-sat · sagittal · 4.0mm · 0.59mm/px · 6 of 16 slices shown (4 of 4)]
[im 1/16]
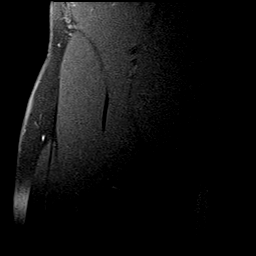
[im 4/16]
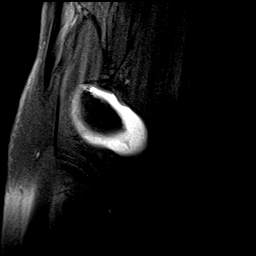
[im 7/16]
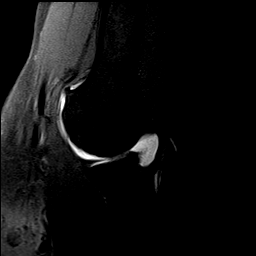
[im 10/16]
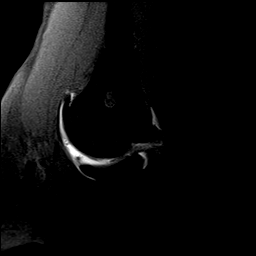
[im 13/16]
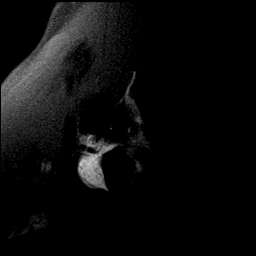
[im 16/16]
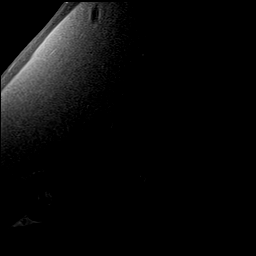

[40 of 40 positions shown; findings below may reference images not displayed]

FINDINGS: Rotator cuff: Rotator cuff tendinopathy appears worst in the
supraspinatus. A bursal sided tear of the anterior and far lateral
supraspinatus measures approximately 1 cm from front to back. There
is little to no retraction. The rotator cuff is otherwise intact.

Muscles: No atrophy or focal lesion.

Biceps long head: The patient is status post biceps tenodesis
without evidence of complication.

Acromioclavicular Joint: Bulky acromioclavicular degenerative
disease with marrow edema about the joint is present. Edema is
slightly decreased since the prior examination. Ossification of the
coracoclavicular acromial ligament consistent with remote AC joint
separation is unchanged.

Glenohumeral Joint: Small osteophyte off medial aspect of the
humeral head has increased in size consistent with progressive
osteoarthritis. No focal cartilage defect is seen.

Labrum: The superior labrum is blunted and has likely been debrided.
No tear is identified.

Bones: No acute abnormality is seen. No focal lesion. There is
contrast in the subacromial/subdeltoid bursa.
IMPRESSION: Rotator cuff tendinopathy is worst in the supraspinatus where there
is a 1 cm from front to back deep bursal sided tear with little to
no retraction no atrophy. Contrast in the subacromial/subdeltoid
bursa is consistent with at least a focal full-thickness component
of the tear.

Bulky acromioclavicular osteoarthritis with marrow edema about the
joint. Edema is slightly decreased compared to the prior
examination.

Increase in the size of a small osteophyte off the humeral head
consistent with progressive glenohumeral osteoarthritis.

Status post biceps tenodesis without evidence of complication.

Ossification of the coracoclavicular ligament consistent with remote
AC joint injury, unchanged.

## 2021-11-11 ENCOUNTER — Other Ambulatory Visit: Payer: Self-pay | Admitting: Internal Medicine

## 2021-11-11 DIAGNOSIS — E781 Pure hyperglyceridemia: Secondary | ICD-10-CM

## 2021-12-15 ENCOUNTER — Ambulatory Visit
Admission: RE | Admit: 2021-12-15 | Discharge: 2021-12-15 | Disposition: A | Discharge status: Home / Self Care | Payer: No Typology Code available for payment source | Source: Ambulatory Visit | Attending: Internal Medicine | Admitting: Internal Medicine

## 2021-12-15 DIAGNOSIS — E781 Pure hyperglyceridemia: Secondary | ICD-10-CM

## 2021-12-27 ENCOUNTER — Ambulatory Visit: Payer: Self-pay | Admitting: Orthopedic Surgery

## 2024-05-16 ENCOUNTER — Encounter: Payer: Self-pay | Admitting: Internal Medicine

## 2024-05-16 ENCOUNTER — Ambulatory Visit: Attending: Internal Medicine | Admitting: Internal Medicine

## 2024-05-16 VITALS — BP 112/82 | HR 76 | Ht 66.0 in | Wt 167.0 lb

## 2024-05-16 DIAGNOSIS — R072 Precordial pain: Secondary | ICD-10-CM | POA: Diagnosis not present

## 2024-05-16 DIAGNOSIS — I2510 Atherosclerotic heart disease of native coronary artery without angina pectoris: Secondary | ICD-10-CM | POA: Diagnosis not present

## 2024-05-16 DIAGNOSIS — R0609 Other forms of dyspnea: Secondary | ICD-10-CM | POA: Diagnosis not present

## 2024-05-16 DIAGNOSIS — E7841 Elevated Lipoprotein(a): Secondary | ICD-10-CM | POA: Diagnosis not present

## 2024-05-16 DIAGNOSIS — I10 Essential (primary) hypertension: Secondary | ICD-10-CM

## 2024-05-16 MED ORDER — METOPROLOL TARTRATE 50 MG PO TABS: 50.0000 mg | ORAL_TABLET | Freq: Once | ORAL | 0 refills | Status: AC

## 2024-05-16 NOTE — Patient Instructions (Addendum)
 Medication Instructions:  One time dose of Metoprolol tartrate 50 mg 2 hour prior to coronary CTA  *If you need a refill on your cardiac medications before your next appointment, please call your pharmacy*   Lab Work: LP( a)  BMP If you have labs (blood work) drawn today and your tests are completely normal, you will receive your results only by: MyChart Message (if you have MyChart) OR A paper copy in the mail If you have any lab test that is abnormal or we need to change your treatment, we will call you to review the results.   Testing/Procedures: Your physician has requested that you have coronary  CTA. Coronary computed tomography (CT)angiogram  is a special type of CT scan that uses a computer to produce multi-dimensional views of major blood vessels throughout the heart.  CT angiography, a contrast material is injected through an IV to help visualize the blood vessels  a painless test that uses an x-ray machine to take clear, detailed pictures of your heart arteries .  Please follow instruction sheet as given.    Follow-Up: At [facility], you and your health needs are our priority.  As part of our continuing mission to provide you with exceptional heart care, we have created designated Provider Care Teams.  These Care Teams include your primary Cardiologist (physician) and Advanced Practice Providers (APPs -  Physician Assistants and Nurse Practitioners) who all work together to provide you with the care you need, when you need it.     Your next appointment:   2 to 3 month(s)   The format for your next appointment:   In Person  Provider:   Vinie JAYSON Maxcy, MD   Other Instructions     Your cardiac CT will be scheduled at one of the below locations:       [facility] [address]  [place], [place] [address]    If scheduled at the [facility] at [address], please enter the parking lot using the [address]  entrance and use the FREE valet service at the patient drop-off area. Enter the building and check-in with registration on the main floor.   Please follow these instructions carefully (unless otherwise directed):  An IV will be required for this test and Nitroglycerin will be given.   Hold all erectile dysfunction medications at least 3 days (72 hrs) prior to test. (Ie viagra, cialis, sildenafil, tadalafil, etc)   On the Night Before the Test: Be sure to Drink plenty of water. Do not consume any caffeinated/decaffeinated beverages or chocolate 12 hours prior to your test. Do not take any antihistamines 12 hours prior to your test.    On the Day of the Test: Drink plenty of water until 1 hour prior to the test. Do not eat any food 1 hour prior to test. You may take your regular medications prior to the test.  Take metoprolol (Lopressor) two hours prior to test.    After the Test: Drink plenty of water. After receiving IV contrast, you may experience a mild flushed feeling. This is normal. On occasion, you may experience a mild rash up to 24 hours after the test. This is not dangerous. If this occurs, you can take Benadryl  25 mg, Zyrtec, Claritin, or Allegra and increase your fluid intake. (Patients taking Tikosyn should avoid Benadryl , and may take Zyrtec, Claritin, or Allegra) If you experience trouble breathing, this can be serious. If it is severe call  [phone] IMMEDIATELY. If it is mild, please call our office.  We will call to schedule your test 2-4 weeks out understanding that some insurance companies will need an authorization prior to the service being performed.   For more information and frequently asked questions, please visit our website : [email or web address]  For non-scheduling related questions, please contact the cardiac imaging nurse navigator should you have any questions/concerns: Cardiac Imaging Nurse Navigators Direct Office Dial: [phone]   For  scheduling needs, including cancellations and rescheduling, please call [facility], [phone].

## 2024-05-16 NOTE — Progress Notes (Unsigned)
 OFFICE CONSULT NOTE  Chief Complaint:  Cardiac evaluation, dyspnea  Primary Care Physician: Shayne Anes, MD  HPI:  Jeff Murphy is a 63 y.o. male who is being seen today for the evaluation of dyspnea at the request of Shayne Anes, MD. 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  He is followed by Dr. Mathews and is referred for evaluation management of coronary artery disease.  There is history of high cholesterol in his mother however she is in her 90s.  Otherwise he is not aware of any early onset coronary disease.  He had lipid testing fairly recently showing total cholesterol 134, triglycerides 60, HDL 42 and LDL 80.  LP(a) was tested and is elevated at 171 nmol/L.  He has described some intermittent chest discomfort and mild shortness of breath with exertion.  It is not always associated with activities in fact he can exercise without issues typically but sometimes he feels like his heart is red lining.  Of note he did have a calcium score which was 42, 55th percentile although calcium was noted in the left main coronary artery only.  Recently he has lost extensive weight with the addition of Zepbound.  He is also on rosuvastatin and ezetimibe.  His LDL particle number retested at 4 and 30 nmol with an LDL of less than 40 and triglycerides of 52.  PMHx:  Past Medical History:  Diagnosis Date   Anxiety    Arthritis    Arthritis of knee    Asthma    CAD (coronary artery disease)    Eczema    Elevated lipoprotein A level    Fatty liver    Gilbert syndrome    Grover's disease    HLD (hyperlipidemia)    HTN (hypertension)    IFG (impaired fasting glucose)    Osteoarthritis    Overweight    Rotator cuff tear    Skin cancer    removed from chest and behind left ear   Skin cancer    Vitamin D deficiency    White coat syndrome with hypertension     Past Surgical History:  Procedure Laterality Date    COLONOSCOPY     KNEE ARTHROSCOPY Bilateral    KNEE SURGERY Left    meniscal debridement   SHOULDER SURGERY Right    tenodesis - ?biceps   SKIN CANCER REMOVAL     TOTAL HIP ARTHROPLASTY Right 06/09/2015   Procedure: RIGHT TOTAL HIP ARTHROPLASTY ANTERIOR APPROACH;  Surgeon: Lonni CINDERELLA Poli, MD;  Location: [facility] OR;  Service: Orthopedics;  Laterality: Right;    FAMHx:  Family History  Problem Relation Age of Onset   Hypertension Father    Hypertension Brother    Diabetes Neg Hx    Heart attack Neg Hx    Hyperlipidemia Neg Hx    Sudden death Neg Hx     SOCHx:   reports that he has never smoked. He has never used smokeless tobacco. He reports current alcohol use. No history on file for drug use.  ALLERGIES:  Allergies  Allergen Reactions   Aspirin Other (See Comments)    Stomach or kidney pain   Neosporin Original [Bacitracin-Neomycin-Polymyxin] Rash   Other Swelling    Tree nuts cause mild throat swelling   Polysporin [Bacitracin-Polymyxin B] Rash    ROS: Pertinent items noted in HPI and remainder of comprehensive ROS otherwise negative.  HOME MEDS: Current Outpatient Medications  on File Prior to Visit  Medication Sig Dispense Refill   Cholecalciferol 50 MCG (2000 UT) CAPS 1 capsule.     ezetimibe (ZETIA) 10 MG tablet 1 tablet Orally Once a day; Duration: 90 days     Multiple Vitamin (MULTI VITAMIN) TABS 1 tablet Orally Once a day     olmesartan (BENICAR) 5 MG tablet take one tablet Orally daily in the evening; Duration: 90 days     rosuvastatin (CRESTOR) 5 MG tablet      Tirzepatide-Weight Management (ZEPBOUND) 12.5 MG/0.5ML SOLN inject 12.5mg  Subcutaneous once a week; Duration: 30 days     triamcinolone  cream (KENALOG ) 0.1 % SMARTSIG:1 sparingly Topical Twice Daily     Ibuprofen -Famotidine  (DUEXIS ) 800-26.6 MG TABS 1 po daily prn pain (Patient not taking: Reported on 05/16/2024) 90 tablet 0   nitroGLYCERIN (NITRODUR - DOSED IN MG/24 HR) 0.2 mg/hr patch APPLY 1/4  PATCH DAILY TO AFFECTED AREA (Patient not taking: Reported on 05/16/2024) 30 patch 0   No current facility-administered medications on file prior to visit.    LABS/IMAGING: No results found for this or any previous visit (from the past 48 hours). No results found.  LIPID PANEL: No results found for: CHOL, TRIG, HDL, CHOLHDL, VLDL, LDLCALC, LDLDIRECT  No results found for: LIPOA    WEIGHTS: Wt Readings from Last 3 Encounters:  05/16/24 167 lb (75.8 kg)  10/24/16 168 lb (76.2 kg)  02/01/16 168 lb (76.2 kg)    VITALS: BP 112/82   Pulse 76   Ht 5' 6 (1.676 m)   Wt 167 lb (75.8 kg)   SpO2 99%   BMI 26.95 kg/m   EXAM: General appearance: alert and no distress Lungs: clear to auscultation bilaterally Heart: regular rate and rhythm, S1, S2 normal, no murmur, click, rub or gallop Extremities: extremities normal, atraumatic, no cyanosis or edema Neurologic: Grossly normal  EKG: EKG Interpretation Date/Time:  Thursday May 16 2024 11:21:32 EST Ventricular Rate:  76 PR Interval:  178 QRS Duration:  88 QT Interval:  386 QTC Calculation: 434 R Axis:   19  Text Interpretation: Normal sinus rhythm with sinus arrhythmia Normal ECG No previous ECGs available Confirmed by Mona Kent [phone]) on 05/16/2024 11:32:12 AM  - personally reviewed  ASSESSMENT: Atypical chest pain and dyspnea Left main coronary calcification, CAC score of 42, 55th percentile (12/2021) Dyslipidemia, goal LDL less than 70 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   Mr. Daily has been describing some atypical chest discomfort and dyspnea.  He was noted however to have left main coronary artery calcification in 2023 and has a high LP(a).  Unfortunately, the LP(a) is not high enough to qualify for the ocean a preventive trial which we are currently conducting.  This would require LP(a) over 200, age over 51 and some demonstrated coronary disease.  Fortunately his blood pressure  is well-controlled.  His lipids have improved substantially with recent weight loss and on rosuvastatin and ezetimibe.  Based on his symptoms and prior coronary calcification I would like to rule out any significant obstructive disease.  Will plan a coronary CT angiogram.  He will likely need 50 mg metoprolol the day of the procedure.  I will contact him with the results afterwards.  Will try to repeat his LP(a) to see if it had possibly drifted up somewhat.  Plan follow-up otherwise in about 3 months.  Thanks again for the kind referral.  Kent KYM Mona, MD, [facility], FNLA, FACP  [facility]  [facility]  Medical Director of the [facility]  [facility] Diplomate of the American Board of Clinical Lipidology Attending Cardiologist  Direct Dial: [phone]  Fax: [phone]  Website:  [email or web address] Vinie BROCKS Exodus Kutzer 05/16/2024, 11:32 AM

## 2024-05-17 ENCOUNTER — Ambulatory Visit: Payer: Self-pay | Admitting: Internal Medicine

## 2024-05-20 LAB — BASIC METABOLIC PANEL WITH GFR
BUN/Creatinine Ratio: 23 (ref 10–24)
BUN: 17 mg/dL (ref 8–27)
CO2: 22 mmol/L (ref 20–29)
Calcium: 9.5 mg/dL (ref 8.6–10.2)
Chloride: 101 mmol/L (ref 96–106)
Creatinine, Ser: 0.74 mg/dL — ABNORMAL LOW (ref 0.76–1.27)
Glucose: 77 mg/dL (ref 70–99)
Potassium: 4.5 mmol/L (ref 3.5–5.2)
Sodium: 138 mmol/L (ref 134–144)
eGFR: 102 mL/min/1.73 (ref >59)

## 2024-05-20 LAB — LIPOPROTEIN A (LPA): Lipoprotein (a): 180.9 nmol/L — ABNORMAL HIGH (ref <75.0)

## 2024-05-27 ENCOUNTER — Encounter (HOSPITAL_COMMUNITY): Payer: Self-pay

## 2024-05-29 ENCOUNTER — Ambulatory Visit (HOSPITAL_COMMUNITY)
Admission: RE | Admit: 2024-05-29 | Source: Ambulatory Visit | Attending: Internal Medicine | Admitting: Internal Medicine

## 2024-07-08 ENCOUNTER — Ambulatory Visit (HOSPITAL_COMMUNITY)
Admission: RE | Admit: 2024-07-08 | Discharge: 2024-07-08 | Disposition: A | Discharge status: Home / Self Care | Source: Ambulatory Visit | Attending: Internal Medicine | Admitting: Internal Medicine

## 2024-07-08 DIAGNOSIS — R072 Precordial pain: Secondary | ICD-10-CM | POA: Insufficient documentation

## 2024-07-08 MED ORDER — IOHEXOL 350 MG/ML SOLN
100.0000 mL | Freq: Once | INTRAVENOUS | Status: AC | PRN
  Administered 2024-07-08: 100 mL via INTRAVENOUS

## 2024-07-08 MED ORDER — NITROGLYCERIN 0.4 MG SL SUBL
0.8000 mg | SUBLINGUAL_TABLET | Freq: Once | SUBLINGUAL | Status: AC
  Administered 2024-07-08: 0.8 mg via SUBLINGUAL

## 2024-08-16 ENCOUNTER — Ambulatory Visit: Payer: Self-pay | Admitting: Internal Medicine
